# Patient Record
Sex: Female | Born: 1940 | Race: Black or African American | Hispanic: No | Smoking: Never smoker
Health system: Southern US, Community
[De-identification: ages and names within clinical notes are randomized; demographics above are authoritative.]

## PROBLEM LIST (undated history)

## (undated) DIAGNOSIS — I10 Essential (primary) hypertension: Secondary | ICD-10-CM

## (undated) DIAGNOSIS — N289 Disorder of kidney and ureter, unspecified: Secondary | ICD-10-CM

## (undated) DIAGNOSIS — E119 Type 2 diabetes mellitus without complications: Secondary | ICD-10-CM

## (undated) DIAGNOSIS — K5790 Diverticulosis of intestine, part unspecified, without perforation or abscess without bleeding: Secondary | ICD-10-CM

## (undated) DIAGNOSIS — N189 Chronic kidney disease, unspecified: Secondary | ICD-10-CM

## (undated) DIAGNOSIS — G5600 Carpal tunnel syndrome, unspecified upper limb: Secondary | ICD-10-CM

## (undated) DIAGNOSIS — K219 Gastro-esophageal reflux disease without esophagitis: Secondary | ICD-10-CM

## (undated) DIAGNOSIS — K5792 Diverticulitis of intestine, part unspecified, without perforation or abscess without bleeding: Secondary | ICD-10-CM

## (undated) DIAGNOSIS — F32A Depression, unspecified: Secondary | ICD-10-CM

## (undated) DIAGNOSIS — K449 Diaphragmatic hernia without obstruction or gangrene: Secondary | ICD-10-CM

## (undated) DIAGNOSIS — C55 Malignant neoplasm of uterus, part unspecified: Secondary | ICD-10-CM

## (undated) DIAGNOSIS — F419 Anxiety disorder, unspecified: Secondary | ICD-10-CM

## (undated) DIAGNOSIS — Z860100 Personal history of colon polyps, unspecified: Secondary | ICD-10-CM

## (undated) DIAGNOSIS — M1990 Unspecified osteoarthritis, unspecified site: Secondary | ICD-10-CM

## (undated) DIAGNOSIS — I2510 Atherosclerotic heart disease of native coronary artery without angina pectoris: Secondary | ICD-10-CM

## (undated) HISTORY — DX: Personal history of colon polyps, unspecified: Z86.0100

## (undated) HISTORY — DX: Diverticulosis of intestine, part unspecified, without perforation or abscess without bleeding: K57.90

## (undated) HISTORY — PX: TUBAL LIGATION: SHX77

## (undated) HISTORY — DX: Malignant neoplasm of uterus, part unspecified: C55

## (undated) HISTORY — DX: Depression, unspecified: F32.A

## (undated) HISTORY — DX: Diaphragmatic hernia without obstruction or gangrene: K44.9

## (undated) HISTORY — PX: ABDOMINAL HYSTERECTOMY: SHX81

## (undated) HISTORY — DX: Anxiety disorder, unspecified: F41.9

## (undated) HISTORY — DX: Chronic kidney disease, unspecified: N18.9

## (undated) HISTORY — DX: Gastro-esophageal reflux disease without esophagitis: K21.9

---

## 2011-03-28 DIAGNOSIS — M7552 Bursitis of left shoulder: Secondary | ICD-10-CM | POA: Insufficient documentation

## 2011-03-28 DIAGNOSIS — I2089 Other forms of angina pectoris: Secondary | ICD-10-CM | POA: Insufficient documentation

## 2011-03-28 DIAGNOSIS — R072 Precordial pain: Secondary | ICD-10-CM | POA: Insufficient documentation

## 2015-09-16 DIAGNOSIS — I10 Essential (primary) hypertension: Secondary | ICD-10-CM | POA: Insufficient documentation

## 2016-05-10 DIAGNOSIS — K8020 Calculus of gallbladder without cholecystitis without obstruction: Secondary | ICD-10-CM | POA: Insufficient documentation

## 2016-06-26 DIAGNOSIS — E782 Mixed hyperlipidemia: Secondary | ICD-10-CM | POA: Insufficient documentation

## 2016-07-09 HISTORY — PX: ESOPHAGOGASTRODUODENOSCOPY: SHX1529

## 2016-07-09 HISTORY — PX: COLONOSCOPY: SHX5424

## 2016-09-25 DIAGNOSIS — G500 Trigeminal neuralgia: Secondary | ICD-10-CM | POA: Insufficient documentation

## 2016-12-14 DIAGNOSIS — N183 Chronic kidney disease, stage 3 unspecified: Secondary | ICD-10-CM | POA: Insufficient documentation

## 2017-03-01 DIAGNOSIS — R2990 Unspecified symptoms and signs involving the nervous system: Secondary | ICD-10-CM | POA: Insufficient documentation

## 2017-05-16 DIAGNOSIS — R109 Unspecified abdominal pain: Secondary | ICD-10-CM | POA: Insufficient documentation

## 2018-02-05 DIAGNOSIS — R931 Abnormal findings on diagnostic imaging of heart and coronary circulation: Secondary | ICD-10-CM | POA: Insufficient documentation

## 2018-02-05 DIAGNOSIS — R9439 Abnormal result of other cardiovascular function study: Secondary | ICD-10-CM | POA: Insufficient documentation

## 2018-02-05 DIAGNOSIS — I25111 Atherosclerotic heart disease of native coronary artery with angina pectoris with documented spasm: Secondary | ICD-10-CM | POA: Insufficient documentation

## 2019-05-16 ENCOUNTER — Other Ambulatory Visit: Payer: Self-pay

## 2019-05-16 ENCOUNTER — Emergency Department (HOSPITAL_COMMUNITY): Payer: Medicare Other

## 2019-05-16 ENCOUNTER — Emergency Department (HOSPITAL_COMMUNITY)
Admission: EM | Admit: 2019-05-16 | Discharge: 2019-05-16 | Disposition: A | Discharge status: Home / Self Care | Payer: Medicare Other | Attending: Emergency Medicine | Admitting: Emergency Medicine

## 2019-05-16 ENCOUNTER — Encounter (HOSPITAL_COMMUNITY): Payer: Self-pay | Admitting: *Deleted

## 2019-05-16 DIAGNOSIS — I2510 Atherosclerotic heart disease of native coronary artery without angina pectoris: Secondary | ICD-10-CM | POA: Insufficient documentation

## 2019-05-16 DIAGNOSIS — Z7982 Long term (current) use of aspirin: Secondary | ICD-10-CM | POA: Insufficient documentation

## 2019-05-16 DIAGNOSIS — I10 Essential (primary) hypertension: Secondary | ICD-10-CM | POA: Insufficient documentation

## 2019-05-16 DIAGNOSIS — Z79899 Other long term (current) drug therapy: Secondary | ICD-10-CM | POA: Diagnosis not present

## 2019-05-16 DIAGNOSIS — N3000 Acute cystitis without hematuria: Secondary | ICD-10-CM

## 2019-05-16 DIAGNOSIS — R55 Syncope and collapse: Secondary | ICD-10-CM

## 2019-05-16 DIAGNOSIS — E119 Type 2 diabetes mellitus without complications: Secondary | ICD-10-CM | POA: Diagnosis not present

## 2019-05-16 HISTORY — DX: Diverticulitis of intestine, part unspecified, without perforation or abscess without bleeding: K57.92

## 2019-05-16 HISTORY — DX: Carpal tunnel syndrome, unspecified upper limb: G56.00

## 2019-05-16 HISTORY — DX: Disorder of kidney and ureter, unspecified: N28.9

## 2019-05-16 HISTORY — DX: Type 2 diabetes mellitus without complications: E11.9

## 2019-05-16 HISTORY — DX: Atherosclerotic heart disease of native coronary artery without angina pectoris: I25.10

## 2019-05-16 HISTORY — DX: Essential (primary) hypertension: I10

## 2019-05-16 HISTORY — DX: Unspecified osteoarthritis, unspecified site: M19.90

## 2019-05-16 LAB — CBC WITH DIFFERENTIAL/PLATELET
Abs Immature Granulocytes: 0.02 10*3/uL (ref 0.00–0.07)
Basophils Absolute: 0 10*3/uL (ref 0.0–0.1)
Basophils Relative: 0 %
Eosinophils Absolute: 0.1 10*3/uL (ref 0.0–0.5)
Eosinophils Relative: 1 %
HCT: 36.1 % (ref 36.0–46.0)
Hemoglobin: 11.9 g/dL — ABNORMAL LOW (ref 12.0–15.0)
Immature Granulocytes: 0 %
Lymphocytes Relative: 14 %
Lymphs Abs: 1.3 10*3/uL (ref 0.7–4.0)
MCH: 31.5 pg (ref 26.0–34.0)
MCHC: 33 g/dL (ref 30.0–36.0)
MCV: 95.5 fL (ref 80.0–100.0)
Monocytes Absolute: 1 10*3/uL (ref 0.1–1.0)
Monocytes Relative: 11 %
Neutro Abs: 6.6 10*3/uL (ref 1.7–7.7)
Neutrophils Relative %: 74 %
Platelets: 260 10*3/uL (ref 150–400)
RBC: 3.78 MIL/uL — ABNORMAL LOW (ref 3.87–5.11)
RDW: 13.7 % (ref 11.5–15.5)
WBC: 9 10*3/uL (ref 4.0–10.5)
nRBC: 0 % (ref 0.0–0.2)

## 2019-05-16 LAB — URINALYSIS, ROUTINE W REFLEX MICROSCOPIC
Bilirubin Urine: NEGATIVE
Glucose, UA: NEGATIVE mg/dL
Ketones, ur: NEGATIVE mg/dL
Nitrite: POSITIVE — AB
Protein, ur: NEGATIVE mg/dL
Specific Gravity, Urine: 1.012 (ref 1.005–1.030)
WBC, UA: >50 WBC/hpf — ABNORMAL HIGH (ref 0–5)
pH: 7 (ref 5.0–8.0)

## 2019-05-16 LAB — COMPREHENSIVE METABOLIC PANEL
ALT: 9 U/L (ref 0–44)
AST: 14 U/L — ABNORMAL LOW (ref 15–41)
Albumin: 2.8 g/dL — ABNORMAL LOW (ref 3.5–5.0)
Alkaline Phosphatase: 52 U/L (ref 38–126)
Anion gap: 9 (ref 5–15)
BUN: 24 mg/dL — ABNORMAL HIGH (ref 8–23)
CO2: 25 mmol/L (ref 22–32)
Calcium: 8.9 mg/dL (ref 8.9–10.3)
Chloride: 102 mmol/L (ref 98–111)
Creatinine, Ser: 1.55 mg/dL — ABNORMAL HIGH (ref 0.44–1.00)
GFR calc Af Amer: 37 mL/min — ABNORMAL LOW (ref >60)
GFR calc non Af Amer: 32 mL/min — ABNORMAL LOW (ref >60)
Glucose, Bld: 162 mg/dL — ABNORMAL HIGH (ref 70–99)
Potassium: 4.4 mmol/L (ref 3.5–5.1)
Sodium: 136 mmol/L (ref 135–145)
Total Bilirubin: 0.6 mg/dL (ref 0.3–1.2)
Total Protein: 6.2 g/dL — ABNORMAL LOW (ref 6.5–8.1)

## 2019-05-16 LAB — LIPASE, BLOOD: Lipase: 18 U/L (ref 11–51)

## 2019-05-16 MED ORDER — CEPHALEXIN 500 MG PO CAPS: 500.0000 mg | ORAL_CAPSULE | Freq: Four times a day (QID) | ORAL | 0 refills | Status: DC

## 2019-05-16 MED ORDER — SODIUM CHLORIDE 0.9 % IV BOLUS
1000.0000 mL | Freq: Once | INTRAVENOUS | Status: AC
  Administered 2019-05-16: 1000 mL via INTRAVENOUS

## 2019-05-16 MED ORDER — SODIUM CHLORIDE 0.9 % IV SOLN
1.0000 g | Freq: Once | INTRAVENOUS | Status: AC
  Administered 2019-05-16: 1 g via INTRAVENOUS
  Filled 2019-05-16: qty 10

## 2019-05-16 MED ORDER — SODIUM CHLORIDE 0.9 % IV SOLN
Freq: Once | INTRAVENOUS | Status: AC
  Administered 2019-05-16: 15:00:00 via INTRAVENOUS

## 2019-05-16 MED ORDER — FUROSEMIDE 20 MG PO TABS: 20.0000 mg | ORAL_TABLET | Freq: Every day | ORAL | 0 refills | Status: AC

## 2019-05-16 NOTE — ED Provider Notes (Signed)
Pt signed out by Dr. Ronnald Nian pending FP resident eval.  Pt is feeling better and now feels comfortable going home.  Pt to stop metoprolol and decrease lasix down to 20 mg.  Pt will be d/c home with keflex for her UTI.  She knows to return if worse.  F/u with FP on Monday the 9th as scheduled.   Isla Pence, MD 05/16/19 [phone]

## 2019-05-16 NOTE — ED Triage Notes (Signed)
PT reported taking a laxative last night  for constipation for 2 weeks  . Pt reported she was in BR and had a abd cramping and near syncope while on toilet . Pt was cold ,sweating and ABD pain.  EMS gave 250 NS IV. PT A/O on arrival  To room . EMS vitals BP 128/66, 54 HR, 185 CBG.  Pt lives with daughter and has just moved from [place] to live with Daughter.

## 2019-05-16 NOTE — ED Notes (Signed)
Patient verbalizes understanding of discharge instructions . Opportunity for questions and answers were provided . Armband removed by staff ,Pt discharged from ED. W/C  offered at D/C  and Declined W/C at D/C and was escorted to lobby by RN.  

## 2019-05-16 NOTE — Discharge Instructions (Addendum)
Stay on Lisinopril. Stop Metoprolol. Take Lasix 20 mg instead of 40 mg.

## 2019-05-16 NOTE — ED Provider Notes (Signed)
[facility] EMERGENCY DEPARTMENT Provider Note   CSN: [id number] Arrival date & time: 05/16/19  0920     History   Chief Complaint Chief Complaint  Patient presents with   Near Syncope    HPI Linda Frye is a 78 y.o. female.     The history is provided by the patient.  Loss of Consciousness Episode history:  Single Most recent episode:  Today Progression:  Resolved Chronicity:  New Context: bowel movement and sitting down   Witnessed: yes   Relieved by:  Bed rest Worsened by:  Nothing Associated symptoms: diaphoresis, dizziness and nausea   Associated symptoms: no anxiety, no chest pain, no fever, no focal weakness, no palpitations, no seizures, no shortness of breath and no vomiting   Risk factors: coronary artery disease   Risk factors: no seizures     Past Medical History:  Diagnosis Date   Acute carpal tunnel syndrome    bilateral wrists   Arthritis    Coronary artery disease    Diabetes mellitus without complication (HCC)    Diverticulitis    Hypertension    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   History reviewed. No pertinent surgical history.   OB History   No obstetric history on file.      Home Medications    Prior to Admission medications   Medication Sig Start Date End Date Taking? Authorizing Provider  aspirin EC 81 MG tablet Take 81 mg by mouth daily.   Yes [provider]  cholecalciferol (VITAMIN D3) 25 MCG (1000 UT) tablet Take 1,000 Units by mouth daily.   Yes [provider]  furosemide (LASIX) 40 MG tablet Take 40 mg by mouth daily.   Yes [provider]  lisinopril (ZESTRIL) 5 MG tablet Take 5 mg by mouth daily.   Yes [provider]  metoprolol tartrate (LOPRESSOR) 25 MG tablet Take 12.5 mg by mouth as needed (for blood pressure >140).    Yes [provider]  nateglinide (STARLIX) 120 MG  tablet Take 120 mg by mouth 3 (three) times daily with meals.   Yes [provider]  paricalcitol (ZEMPLAR) 1 MCG capsule Take 1 mcg by mouth daily.   Yes [provider]  ranolazine (RANEXA) 1000 MG SR tablet Take 500 mg by mouth 2 (two) times daily.   Yes [provider]  simvastatin (ZOCOR) 40 MG tablet Take 40 mg by mouth daily.   Yes [provider]  tetrahydrozoline 0.05 % ophthalmic solution Place 1 drop into both eyes daily as needed (dry eyes).   Yes [provider]  vitamin B-12 (CYANOCOBALAMIN) 500 MCG tablet Take 500 mcg by mouth daily.   Yes [provider]    Family History History reviewed. No pertinent family history.  Social History Social History   Tobacco Use   Smoking status: Never Smoker   Smokeless tobacco: Never Used  Substance Use Topics   Alcohol use: Not Currently   Drug use: Never     Allergies   Patient has no known allergies.   Review of Systems Review of Systems  Constitutional: Positive for diaphoresis. Negative for chills and fever.  HENT: Negative for ear pain and sore throat.   Eyes: Negative for pain and visual disturbance.  Respiratory: Negative for cough and shortness of breath.   Cardiovascular: Positive for syncope. Negative for chest pain and palpitations.  Gastrointestinal:  Positive for constipation and nausea. Negative for abdominal pain and vomiting.  Genitourinary: Negative for dysuria and hematuria.  Musculoskeletal: Negative for arthralgias and back pain.  Skin: Negative for color change and rash.  Neurological: Positive for dizziness. Negative for focal weakness, seizures and syncope.  All other systems reviewed and are negative.    Physical Exam Updated Vital Signs BP 103/84 (BP Location: Right Arm)    Pulse 62    Temp 98 F (36.7 C) (Oral)    Resp 20    Ht  (1.549 m)    Wt 99.8 kg    SpO2 100%    BMI 41.57 kg/m   Physical Exam Vitals signs and nursing note  reviewed.  Constitutional:      General: She is not in acute distress.    Appearance: She is well-developed.  HENT:     Head: Normocephalic and atraumatic.     Nose: Nose normal.     Mouth/Throat:     Mouth: Mucous membranes are moist.  Eyes:     Extraocular Movements: Extraocular movements intact.     Conjunctiva/sclera: Conjunctivae normal.     Pupils: Pupils are equal, round, and reactive to light.  Neck:     Musculoskeletal: Normal range of motion and neck supple.  Cardiovascular:     Rate and Rhythm: Normal rate and regular rhythm.     Pulses: Normal pulses.     Heart sounds: Normal heart sounds. No murmur.  Pulmonary:     Effort: Pulmonary effort is normal. No respiratory distress.     Breath sounds: Normal breath sounds.  Abdominal:     General: Abdomen is flat.     Palpations: Abdomen is soft. There is no mass.     Tenderness: There is abdominal tenderness. There is no guarding or rebound.     Hernia: No hernia is present.  Musculoskeletal: Normal range of motion.        General: No swelling.  Skin:    General: Skin is warm and dry.  Neurological:     General: No focal deficit present.     Mental Status: She is alert and oriented to person, place, and time.     Cranial Nerves: No cranial nerve deficit.     Sensory: No sensory deficit.     Motor: No weakness.     Coordination: Coordination normal.  Psychiatric:        Mood and Affect: Mood normal.      ED Treatments / Results  Labs (all labs ordered are listed, but only abnormal results are displayed) Labs Reviewed  CBC WITH DIFFERENTIAL/PLATELET - Abnormal; Notable for the following components:      Result Value   RBC 3.78 (*)    Hemoglobin 11.9 (*)    All other components within normal limits  COMPREHENSIVE METABOLIC PANEL - Abnormal; Notable for the following components:   Glucose, Bld 162 (*)    BUN 24 (*)    Creatinine, Ser 1.55 (*)    Total Protein 6.2 (*)    Albumin 2.8 (*)    AST 14 (*)     GFR calc non Af Amer 32 (*)    GFR calc Af Amer 37 (*)    All other components within normal limits  URINALYSIS, ROUTINE W REFLEX MICROSCOPIC - Abnormal; Notable for the following components:   APPearance CLOUDY (*)    Hgb urine dipstick SMALL (*)    Nitrite POSITIVE (*)    Leukocytes,Ua LARGE (*)  WBC, UA >50 (*)    Bacteria, UA MANY (*)    All other components within normal limits  URINE CULTURE  LIPASE, BLOOD    EKG EKG Interpretation  Date/Time:  Saturday May 16 2019 09:26:08 EST Ventricular Rate:  62 PR Interval:    QRS Duration: 77 QT Interval:  423 QTC Calculation: 430 R Axis:   14 Text Interpretation: Sinus rhythm Confirmed by Virgina NorfolkAdam, Aisha Greenberger [phone]([id number]) on 05/16/2019 9:33:12 AM   Radiology Ct Abdomen Pelvis Wo Contrast  Result Date: 05/16/2019 CLINICAL DATA:  Abdominal distension, constipation, syncope EXAM: CT ABDOMEN AND PELVIS WITHOUT CONTRAST TECHNIQUE: Multidetector CT imaging of the abdomen and pelvis was performed following the standard protocol without IV contrast. COMPARISON:  None. FINDINGS: Lower chest: Very mild subpleural reticulation in the visualized lung bases. Hepatobiliary: Unenhanced liver is unremarkable. Layering small gallstones (series 3/image 24), without associated inflammatory changes. No intrahepatic or extrahepatic duct dilatation. Pancreas: Within normal limits. Spleen: Within normal limits. Adrenals/Urinary Tract: Adrenal glands are within normal limits. 7 mm lateral right lower pole renal cyst (series 3/image 40). Left kidney is within normal limits. No renal, ureteral, or bladder calculi. Right extrarenal pelvis. No hydronephrosis. Bladder is within normal limits. Stomach/Bowel: Stomach is within normal limits. No evidence of bowel obstruction. Normal appendix (series 3/image 54). Extensive left colonic diverticulosis, without evidence of diverticulitis. Normal colonic stool burden. Vascular/Lymphatic: No evidence of abdominal aortic aneurysm.  Atherosclerotic calcifications of the abdominal aorta and branch vessels. No suspicious abdominopelvic lymphadenopathy. Reproductive: Status post hysterectomy. No adnexal masses. Other: No abdominopelvic ascites. Musculoskeletal: Degenerative changes of the visualized thoracolumbar spine. IMPRESSION: No evidence of bowel obstruction. Normal appendix. Extensive left colonic diverticulosis, without evidence of diverticulitis. Normal colonic stool burden. Cholelithiasis, without associated inflammatory changes. Additional ancillary findings as above. Electronically Signed   By: Charline BillsSriyesh  Krishnan M.D.   On: 05/16/2019 12:19   Ct Head Wo Contrast  Result Date: 05/16/2019 CLINICAL DATA:  78 year old female with head trauma EXAM: CT HEAD WITHOUT CONTRAST TECHNIQUE: Contiguous axial images were obtained from the base of the skull through the vertex without intravenous contrast. COMPARISON:  None. FINDINGS: Brain: No acute intracranial hemorrhage. No midline shift or mass effect. Gray-white differentiation maintained. Unremarkable appearance of the ventricular system. Vascular: Intracranial atherosclerosis Skull: No acute fracture.  No aggressive bone lesion identified. Sinuses/Orbits: Unremarkable appearance of the orbits. Mastoid air cells clear. No middle ear effusion. No significant sinus disease. Other: None IMPRESSION: Negative head CT Electronically Signed   By: Gilmer MorJaime  Wagner D.O.   On: 05/16/2019 12:12    Procedures Procedures (including critical care time)  Medications Ordered in ED Medications  cefTRIAXone (ROCEPHIN) 1 g in sodium chloride 0.9 % 100 mL IVPB (has no administration in time range)  0.9 %  sodium chloride infusion (has no administration in time range)  sodium chloride 0.9 % bolus 1,000 mL (0 mLs Intravenous Stopped 05/16/19 1151)     Initial Impression / Assessment and Plan / ED Course  I have reviewed the triage vital signs and the nursing notes.  Pertinent labs & imaging results  that were available during my care of the patient were reviewed by me and considered in my medical decision making (see chart for details).     Yevette EdwardsCatherine Iezzi is a 78 year old female with history of diabetes, CAD who presents to the ED after syncopal event.  Patient with unremarkable vitals, no fever.  Patient was sitting on the toilet wall trying to have a bowel movement when she got cold,  sweaty, lightheaded and possibly lost consciousness for a minute or 2.  Did not fall or hit her head per daughter but family not quite confident about that.  Patient appears neurologically intact.  She is asymptomatic now.  She has been taking a laxative for constipation for the last several weeks but states that she has been started to have some diarrhea, abdominal cramping.  Has some abdominal tenderness diffusely on exam.  No focal tenderness on abdominal exam.  History and physical appears consistent with likely vasovagal event/orthostatic event.  Does not have any chest pain or shortness of breath.  Has been feeling generally well.  Has had some abdominal discomfort for the last day or so.  We will get basic labs.  EKG shows sinus rhythm.  No ischemic changes.  Doubt cardiac process.  Will obtain CT scan abdomen and pelvis.  Will get CT scan of head as unsure if patient hit her head.  Will give IV fluid hydration.  Will reevaluate.  Possible dehydration versus electrolyte abnormalities versus constipation versus bowel obstruction.  Patient did get mildly hypotensive with orthostatics.  Urinalysis showed UTI.  Otherwise no significant electrolyte abnormality.  Creatinine mildly elevated.  No prior labs to compare to.  Patient recently moved here.  CT scan of the head was unremarkable.  CT abdomen and pelvis showed no acute findings.  No bowel obstruction.  Overall appears to be dehydrated with positive orthostatics and with urinary tract infection.  Will admit for observation stay with medicine.  This chart was  dictated using voice recognition software.  Despite best efforts to proofread,  errors can occur which can change the documentation meaning.      Final Clinical Impressions(s) / ED Diagnoses   Final diagnoses:  Syncope and collapse  Acute cystitis without hematuria    ED Discharge Orders    None       Virgina Norfolk, DO 05/16/19 1441

## 2019-05-16 NOTE — H&P (Addendum)
Family Medicine Teaching [facility] Admission History and Physical Service Pager: [phone]  Patient name: Linda Frye Medical record number: [id number] Date of birth: 1940-10-07 Age: 78 y.o. Gender: female  Primary Care Provider: Patient, No Pcp Per Consultants: none Code Status: Full Preferred Emergency Contact: Lenoria Farrier [phone]) [phone]  Chief Complaint: passing out  Assessment and Plan: Asma Boldon is a 78 y.o. female presenting with syncopal episode. PMH is significant for CAD, hypertension, CKD, diabetes type 2, diverticulitis, carpal tunnel, gallstones  Syncopal episode likely secondary to orthostatic hypotension likely 2/2 to dehydration Patient was repositioning from sitting to standing position when she developed dizziness, diaphoresis and passed out for 3 to 4 minutes. Regained consciousness shortly thereafter and can recall events prior to syncopal episode. This episode was witnessed by her daughter who denied her hitting her head. No jerky movements, no incontinence of bowel or bladder. Denies any chest pain, shortness of breath, nausea or vomiting.  CT head negative for intracranial hemorrhage or acute infarct.  Labs significant for creatinine 1.55, GFR 37.  EKG shows normal sinus rhythm heart rate 62 QTc 430, negative for arrhythmia, pt on metoprolol. On exam patient alert and orientated x4. Currently back to baseline. The patient has been constipated recently, for which she took a laxative last night. She woke up this morning having loose stools which is what sent her to the bathroom.  No current dizziness, chest pain or shortness of breath.  Given that patient was having diarrhea, on Lasix and concentrated urine most likely syncopal episode due to dehydration and orthostatic hypotension. EKG was negative for arryhthmia. -In discussion with patient and family it was decided to discharge patient home and follow-up with Dr. Karen Chafe in clinic on Monday at 1:30  PM. -Hold metoprolol -Decrease Lasix to 20 mg daily -Continue Lisinopril 5 mg daily, consider switch to ARB to decrease cough -Encourage po intake 1.5L daily -Encourage compression stockings. -Patient instructed to return to ED if worsening syncopal episodes prior to seeing doctor in clinic on Monday.  Dehydration Patient taking Lasix 40 mg daily.  Recently having diarrhea from laxatives.  Also reports having concentrated urine.  Normal saline 1 L bolus given in ED.  -Encourage oral hydration -Decrease Lasix to 20 mg daily -Follow-up in clinic on Monday for further evaluation. -Recheck BUN and creatinine outpatient  Hypotension Patient was hypotensive initially in the ED with systolic blood pressure in the 80s.  She was given a liter bolus normal saline.  Most recent blood pressure 110/52, was 125/65 on repeat check in the room s/p fluid.  Orthostatic vitals completed.  Home medications metoprolol 12.5 mg for blood pressure greater than 140.  Lasix 40 mg daily.  Lisinopril 5 mg daily.  Ranexa 500 mg twice daily -Decrease Lasix to 20 mg daily -Hold metoprolol -Continue Renexa for now but may need to be discontinued outpatient - know that this med can cause/worsen Orthostatic Hypotension and Bradycardia  CKD Creatinine 1.55. GFR 37, BL Creatinine Unknown. Patient currently on lisinopril 5 mg daily.  Reports having cough since started medication.  Patient also reports history of chronic kidney disease.  States that her kidneys working at 30% function.  CT abdomen showed a 7 mm lateral right lower pole renal cyst. -Continue oral hydration -Follow-up in clinic for repeat kidney function test. -Continue lisinopril 5 mg daily -Consider switch to low dose ARB to decrease cough side effect . -Consider consult nephrology outpatient.  CAD Patient reports having been hospitalized in September 2020 with chest pain.  Stress test at that time was negative.  Currently asymptomatic.  Home medication  Ranexa. -Continue Ranexa until seen in clinic on Monday. -Consider cardiology outpatient follow-up as Ranexa can cause hypotension, orthostatic hypotension and bradycardia.  Type 2 diabetes Patient reports last hemoglobin A1c 6.1.  Home medication Starlix 120 mg 3 times daily.  Serum glucose 162 on admission. -Continue current medication  Asymptomatic bacteriuria Patient denies any dysuria, urgency or frequency.  She does have frequency due to taking Lasix 40 mg daily.  Denies any fever, abdominal pain.  Urinalysis was collected in the ED and was positive for nitrates, large amount of leukocytes and squamous epithelial cells.  Likely secondary to dirty catch.  Patient reports having found herself while trying to get the bathroom earlier today.  She was treated with IV ceftriaxone x1 in ED. -Continue antibiotics -Follow-up in clinic on Monday.   Disposition: Patient's vitals are improving after receiving IV fluids. She feels safe enough to discharge home and follow up outpatient. Patient will therefore be discharged home with follow-up appointment Monday, November 11 at 1:30 PM with Dr[facility].  History of Present Illness:  Linda EdwardsCatherine Frye is a 78 y.o. female presenting with syncopal episode   Patient was in bed and felt the need to move her bowels.  She had been constipated for a few days and was taking laxatives.  She was making her into the bathroom and had soiled herself with diarrhea.  She reports when she had finished she was in the process of cleaning herself up when she felt dizzy and started to pass out.  She recalls getting up off the toilet when she began to have sweating episode.  Her daughter came to check on her reports that mother passed out for about 3 to 4 minutes.  No incontinence of urine or defecation.  No seizure-like activity.  EMS was called at that time.  She went back to check on her mom who was alert at this time but did not know what happened.  Daughter  thought maybe blood sugars were low, and when checked they were in the 160s.  EMS arrived and she was brought to the ED for further evaluation.  Patient reports feeling dizzy for the past few months and recently has gotten worse.  She reports having some balance issues that have not been new but seem to be getting worse.  She denies any fever, nausea vomiting, no chest pain or shortness of breath.  She does report a cough but attributes it to taking lisinopril.  No decrease in appetite.  She reports drinking lots of water about 132 ounce bottle daily.  She also reports drinking lots of coffee and juice.  She denies any urinary symptoms, no dysuria, urgency, abdominal pain or fever.  Her daughter reports she has been sleeping a lot harder lately.  Patient also reports some decrease in vision and was recently seen by ophthalmology.  She does wear glasses daily.  She denies any tobacco use, EtOH use or street drugs.  She reports having history of cervical cancer and hysterectomy few years ago.   In the ED she was given 1 L normal saline bolus, ceftriaxone 1 g IV.  Labs significant for creatinine 1.55.  Urinalysis positive for leukocytes, nitrates, and squamous epithelial cells indicating dirty catch.  CT abdomen/pelvis shows no evidence of bowel obstruction.  Also extensive left colonic diverticulosis without evidence of diverticulitis.  Also significant for cholelithiasis without any inflammatory changes.  As well  as 7 mm right lower pole renal cyst.  CT head negative for acute intracranial hemorrhage or infarct.  EKG shows normal sinus rhythm.  Review Of Systems: Per HPI with the following additions:   Review of Systems  Constitutional: Positive for diaphoresis. Negative for fever.  Respiratory: Positive for cough. Negative for shortness of breath.   Cardiovascular: Negative for chest pain.  Gastrointestinal: Positive for constipation and diarrhea. Negative for nausea and vomiting.  Genitourinary:  Positive for frequency. Negative for dysuria and hematuria.  Skin: Negative for rash.  Neurological: Positive for dizziness.    There are no active problems to display for this patient.   Past Medical History: Past Medical History:  Diagnosis Date  . Acute carpal tunnel syndrome    bilateral wrists  . Arthritis   . Coronary artery disease   . Diabetes mellitus without complication ([place])   . Diverticulitis   . Hypertension   . Renal disorder    kidneys 35% function    Past Surgical History: History reviewed. No pertinent surgical history.  Social History: Social History   Tobacco Use  . Smoking status: Never Smoker  . Smokeless tobacco: Never Used  Substance Use Topics  . Alcohol use: Not Currently  . Drug use: Never   Additional social history: Husband recently passed away few months ago.  She recently moved to [place] area and lives with daughter. Please also refer to relevant sections of EMR.  Family History: History reviewed. No pertinent family history. (If not completed, MUST add something in)  Allergies and Medications: No Known Allergies No current facility-administered medications on file prior to encounter.    Current Outpatient Medications on File Prior to Encounter  Medication Sig Dispense Refill  . aspirin EC 81 MG tablet Take 81 mg by mouth daily.    . cholecalciferol (VITAMIN D3) 25 MCG (1000 UT) tablet Take 1,000 Units by mouth daily.    Marland Kitchen lisinopril (ZESTRIL) 5 MG tablet Take 5 mg by mouth daily.    . nateglinide (STARLIX) 120 MG tablet Take 120 mg by mouth 3 (three) times daily with meals.    . paricalcitol (ZEMPLAR) 1 MCG capsule Take 1 mcg by mouth daily.    . ranolazine (RANEXA) 1000 MG SR tablet Take 500 mg by mouth 2 (two) times daily.    . simvastatin (ZOCOR) 40 MG tablet Take 40 mg by mouth daily.    Marland Kitchen tetrahydrozoline 0.05 % ophthalmic solution Place 1 drop into both eyes daily as needed (dry eyes).    . vitamin B-12 (CYANOCOBALAMIN)  500 MCG tablet Take 500 mcg by mouth daily.    . [DISCONTINUED] metoprolol tartrate (LOPRESSOR) 25 MG tablet Take 12.5 mg by mouth as needed (for blood pressure >140).       Objective: BP (!) 112/52   Pulse 61   Temp 98 F (36.7 C) (Oral)   Resp 19   Ht 5\' 1"  (1.549 m)   Wt 99.8 kg   SpO2 99%   BMI 41.57 kg/m  Exam: General: 78 year old female in no acute distress Eyes: Pupils equal and reactive to light, able to produce tears ENTM: TMs normal, ear canals normal, mucous membranes moist.  Neck: Supple, nontender, no lymphadenopathy Cardiovascular: Regular rate and rhythm, no murmurs appreciated, no gallops, no rubs Respiratory: Chest clear to auscultation bilaterally, no crackles, no rhonchi, no wheezes noted, no increased work of breathing Gastrointestinal: Soft, nontender, nondistended, bowel sounds present MSK: Moving all extremities, bilateral lower extremity edema extending to mid calf.  Derm: Warm and dry Neuro: Alert and orientated x4, cranial nerves III to XII intact Psych: Pleasant and cooperative, follows commands, answers questions appropriately  Labs and Imaging: CBC BMET  Recent Labs  Lab 05/16/19 1017  WBC 9.0  HGB 11.9*  HCT 36.1  PLT 260   Recent Labs  Lab 05/16/19 1017  NA 136  K 4.4  CL 102  CO2 25  BUN 24*  CREATININE 1.55*  GLUCOSE 162*  CALCIUM 8.9     EKG: Sinus rhythm Vent. rate 62 BPM PR interval * ms QRS duration 77 ms QT/QTc 423/430 ms  P-R-T axes 66 14 54  Ct Abdomen Pelvis Wo Contrast  Result Date: 05/16/2019 CLINICAL DATA:  Abdominal distension, constipation, syncope EXAM: CT ABDOMEN AND PELVIS WITHOUT CONTRAST TECHNIQUE: Multidetector CT imaging of the abdomen and pelvis was performed following the standard protocol without IV contrast. COMPARISON:  None. FINDINGS: Lower chest: Very mild subpleural reticulation in the visualized lung bases. Hepatobiliary: Unenhanced liver is unremarkable. Layering small gallstones (series 3/image  24), without associated inflammatory changes. No intrahepatic or extrahepatic duct dilatation. Pancreas: Within normal limits. Spleen: Within normal limits. Adrenals/Urinary Tract: Adrenal glands are within normal limits. 7 mm lateral right lower pole renal cyst (series 3/image 40). Left kidney is within normal limits. No renal, ureteral, or bladder calculi. Right extrarenal pelvis. No hydronephrosis. Bladder is within normal limits. Stomach/Bowel: Stomach is within normal limits. No evidence of bowel obstruction. Normal appendix (series 3/image 54). Extensive left colonic diverticulosis, without evidence of diverticulitis. Normal colonic stool burden. Vascular/Lymphatic: No evidence of abdominal aortic aneurysm. Atherosclerotic calcifications of the abdominal aorta and branch vessels. No suspicious abdominopelvic lymphadenopathy. Reproductive: Status post hysterectomy. No adnexal masses. Other: No abdominopelvic ascites. Musculoskeletal: Degenerative changes of the visualized thoracolumbar spine. IMPRESSION: No evidence of bowel obstruction. Normal appendix. Extensive left colonic diverticulosis, without evidence of diverticulitis. Normal colonic stool burden. Cholelithiasis, without associated inflammatory changes. Additional ancillary findings as above. Electronically Signed   By: Charline Bills M.D.   On: 05/16/2019 12:19   Ct Head Wo Contrast  Result Date: 05/16/2019 CLINICAL DATA:  78 year old female with head trauma EXAM: CT HEAD WITHOUT CONTRAST TECHNIQUE: Contiguous axial images were obtained from the base of the skull through the vertex without intravenous contrast. COMPARISON:  None. FINDINGS: Brain: No acute intracranial hemorrhage. No midline shift or mass effect. Gray-white differentiation maintained. Unremarkable appearance of the ventricular system. Vascular: Intracranial atherosclerosis Skull: No acute fracture.  No aggressive bone lesion identified. Sinuses/Orbits: Unremarkable appearance of  the orbits. Mastoid air cells clear. No middle ear effusion. No significant sinus disease. Other: None IMPRESSION: Negative head CT Electronically Signed   By: Gilmer Mor D.O.   On: 05/16/2019 12:12    Dana Allan, MD 05/16/2019, 6:19 PM PGY-1, [facility] FPTS Intern pager: [phone], text pages welcome   FPTS Upper-Level Resident Addendum   I have independently interviewed and examined the patient. I have discussed the above with the original author and agree with their documentation. My edits for correction/addition/clarification are in blue. Please see also any attending notes.    Peggyann Shoals, DO PGY-2, [facility] 05/16/2019 6:52 PM  FPTS Service pager: [phone] (text pages welcome through [employer])

## 2019-05-18 ENCOUNTER — Ambulatory Visit (INDEPENDENT_AMBULATORY_CARE_PROVIDER_SITE_OTHER): Payer: Medicare Other | Admitting: Family Medicine

## 2019-05-18 ENCOUNTER — Encounter: Payer: Self-pay | Admitting: Family Medicine

## 2019-05-18 ENCOUNTER — Other Ambulatory Visit: Payer: Self-pay

## 2019-05-18 VITALS — BP 98/52 | HR 60 | Wt 216.4 lb

## 2019-05-18 DIAGNOSIS — E1121 Type 2 diabetes mellitus with diabetic nephropathy: Secondary | ICD-10-CM

## 2019-05-18 DIAGNOSIS — N3000 Acute cystitis without hematuria: Secondary | ICD-10-CM

## 2019-05-18 DIAGNOSIS — I951 Orthostatic hypotension: Secondary | ICD-10-CM | POA: Diagnosis not present

## 2019-05-18 DIAGNOSIS — N390 Urinary tract infection, site not specified: Secondary | ICD-10-CM | POA: Insufficient documentation

## 2019-05-18 DIAGNOSIS — N1832 Chronic kidney disease, stage 3b: Secondary | ICD-10-CM | POA: Diagnosis not present

## 2019-05-18 DIAGNOSIS — R55 Syncope and collapse: Secondary | ICD-10-CM | POA: Diagnosis not present

## 2019-05-18 DIAGNOSIS — N183 Chronic kidney disease, stage 3 unspecified: Secondary | ICD-10-CM | POA: Insufficient documentation

## 2019-05-18 DIAGNOSIS — E119 Type 2 diabetes mellitus without complications: Secondary | ICD-10-CM | POA: Insufficient documentation

## 2019-05-18 DIAGNOSIS — E1122 Type 2 diabetes mellitus with diabetic chronic kidney disease: Secondary | ICD-10-CM

## 2019-05-18 LAB — URINE CULTURE: Culture: >=100000 — AB

## 2019-05-18 MED ORDER — OMEPRAZOLE 20 MG PO CPDR: 20.0000 mg | DELAYED_RELEASE_CAPSULE | Freq: Every day | ORAL | 3 refills | Status: DC

## 2019-05-18 MED ORDER — VALSARTAN 40 MG PO TABS: 40.0000 mg | ORAL_TABLET | Freq: Every day | ORAL | 3 refills | Status: DC

## 2019-05-18 NOTE — Assessment & Plan Note (Signed)
Continue Keflex 500mg  QID for 5 more days

## 2019-05-18 NOTE — Progress Notes (Addendum)
Subjective: Chief Complaint  Patient presents with  . Hospitalization Follow-up    HPI: Linda Frye is a 78 y.o. presenting to clinic today to discuss the following:  Hospital Follow Up for Syncope Patient is a 78y/o female with PMH of HTN, T2DM, CKDIII here for a hospital follow up after an episode of what appeared to be vasovagal syncope. She was found to have CKD stage III while in the hospital and reports seeing a kidney doctor in here former state of [place]. She states today she feels much better and has had no more episodes of syncope or near syncope. She denies chest pain, SOB, dizziness, or any difficulty breathing. She does report to me having a cough on lisinopril. She is taking Keflex for her UTI as directed. She confirms she is no longer taking Metoprolol and is only taking 20mg  of Lasix. She is not checking her morning fasting glucose or daily weights at home.   ROS noted in HPI.    Social History   Tobacco Use  Smoking Status Never Smoker  Smokeless Tobacco Never Used    Objective: BP (!) 98/52   Pulse 60   Wt 216 lb 6.4 oz (98.2 kg)   SpO2 99%   BMI 40.89 kg/m  Vitals and nursing notes reviewed  Physical Exam Gen: Alert and Oriented x 3, NAD HEENT: Normocephalic, atraumatic, PERRLA, EOMI CV: RRR, no murmurs, normal S1, S2 split Resp: CTAB, no wheezing, rales, or rhonchi, comfortable work of breathing Abd: obese, non-tender, soft Ext: no clubbing, cyanosis, +1 bilateral LE edema Skin: warm, dry, intact, no rashes   Results for orders placed or performed during the hospital encounter of 05/16/19 (from the past 72 hour(s))  CBC with Differential     Status: Abnormal   Collection Time: 05/16/19 10:17 AM  Result Value Ref Range   WBC 9.0 4.0 - 10.5 K/uL   RBC 3.78 (L) 3.87 - 5.11 MIL/uL   Hemoglobin 11.9 (L) 12.0 - 15.0 g/dL   HCT 36.1 36.0 - 46.0 %   MCV 95.5 80.0 - 100.0 fL   MCH 31.5 26.0 - 34.0 pg   MCHC 33.0 30.0 - 36.0 g/dL   RDW  13.7 11.5 - 15.5 %   Platelets 260 150 - 400 K/uL   nRBC 0.0 0.0 - 0.2 %   Neutrophils Relative % 74 %   Neutro Abs 6.6 1.7 - 7.7 K/uL   Lymphocytes Relative 14 %   Lymphs Abs 1.3 0.7 - 4.0 K/uL   Monocytes Relative 11 %   Monocytes Absolute 1.0 0.1 - 1.0 K/uL   Eosinophils Relative 1 %   Eosinophils Absolute 0.1 0.0 - 0.5 K/uL   Basophils Relative 0 %   Basophils Absolute 0.0 0.0 - 0.1 K/uL   Immature Granulocytes 0 %   Abs Immature Granulocytes 0.02 0.00 - 0.07 K/uL    Comment: Performed at [facility], [address] [place], [place] [address]  Comprehensive metabolic panel     Status: Abnormal   Collection Time: 05/16/19 10:17 AM  Result Value Ref Range   Sodium 136 135 - 145 mmol/L   Potassium 4.4 3.5 - 5.1 mmol/L   Chloride 102 98 - 111 mmol/L   CO2 25 22 - 32 mmol/L   Glucose, Bld 162 (H) 70 - 99 mg/dL   BUN 24 (H) 8 - 23 mg/dL   Creatinine, Ser 1.55 (H) 0.44 - 1.00 mg/dL   Calcium 8.9 8.9 - 10.3 mg/dL  Total Protein 6.2 (L) 6.5 - 8.1 g/dL   Albumin 2.8 (L) 3.5 - 5.0 g/dL   AST 14 (L) 15 - 41 U/L   ALT 9 0 - 44 U/L   Alkaline Phosphatase 52 38 - 126 U/L   Total Bilirubin 0.6 0.3 - 1.2 mg/dL   GFR calc non Af Amer 32 (L) >60 mL/min   GFR calc Af Amer 37 (L) >60 mL/min   Anion gap 9 5 - 15    Comment: Performed at [facility], [address], [place], [place] [address]  Lipase, blood     Status: None   Collection Time: 05/16/19 10:17 AM  Result Value Ref Range   Lipase 18 11 - 51 U/L    Comment: Performed at [facility], [address], [place], [place] [address]  Urinalysis, Routine w reflex microscopic     Status: Abnormal   Collection Time: 05/16/19  1:15 PM  Result Value Ref Range   Color, Urine YELLOW YELLOW   APPearance CLOUDY (A) CLEAR   Specific Gravity, Urine 1.012 1.005 - 1.030   pH 7.0 5.0 - 8.0   Glucose, UA NEGATIVE NEGATIVE mg/dL   Hgb urine dipstick SMALL (A) NEGATIVE   Bilirubin Urine NEGATIVE NEGATIVE   Ketones, ur  NEGATIVE NEGATIVE mg/dL   Protein, ur NEGATIVE NEGATIVE mg/dL   Nitrite POSITIVE (A) NEGATIVE   Leukocytes,Ua LARGE (A) NEGATIVE   RBC / HPF 6-10 0 - 5 RBC/hpf   WBC, UA >50 (H) 0 - 5 WBC/hpf   Bacteria, UA MANY (A) NONE SEEN   Squamous Epithelial / LPF 6-10 0 - 5    Comment: Performed at [facility], [address] [place], [place] [address]  Urine culture     Status: Abnormal   Collection Time: 05/16/19  1:15 PM   Specimen: Urine, Random  Result Value Ref Range   Specimen Description URINE, RANDOM    Special Requests      SITE NOT SPECIFIED Performed at [facility], [address] [place], [place] [address]    Culture >=100,000 COLONIES/mL ESCHERICHIA COLI (A)    Report Status 05/18/2019 FINAL    Organism ID, Bacteria ESCHERICHIA COLI (A)       Susceptibility   Escherichia coli - MIC*    AMPICILLIN <=2 SENSITIVE Sensitive     CEFAZOLIN <=4 SENSITIVE Sensitive     CEFTRIAXONE <=1 SENSITIVE Sensitive     CIPROFLOXACIN <=0.25 SENSITIVE Sensitive     GENTAMICIN <=1 SENSITIVE Sensitive     IMIPENEM <=0.25 SENSITIVE Sensitive     NITROFURANTOIN <=16 SENSITIVE Sensitive     TRIMETH/SULFA <=20 SENSITIVE Sensitive     AMPICILLIN/SULBACTAM <=2 SENSITIVE Sensitive     PIP/TAZO <=4 SENSITIVE Sensitive     Extended ESBL NEGATIVE Sensitive     * >=100,000 COLONIES/mL ESCHERICHIA COLI    Assessment/Plan:  Chronic kidney disease (CKD), stage III (moderate) - Repeat BMET today - Referral to Nephrology made today - F/u in 4-6 weeks - Stopping Lisinopril due to cough but starting ARB valsartan to continue kidney protective BP control medication.  T2DM (type 2 diabetes mellitus) (HCC) Patient with T2DM not taking daily BG diary. Reports being well controlled on SU nateglinide for a long time. Did not tolerate Metformin and given her CKD III probably not the best choice at this time.  - Start checking BG fasting every morning. - Repeat A1c at next office visit. - Cont  nateglinide for  now but consider SGLT-2 inhibitor for kidney protective benefits at next appointment.  Syncope Syncopal episode most likely due to a combination of orthostatic hypotension, dehydration secondary to diarrhea, and vasovagal episode after straining while on the toilet. No episodes of syncope or near syncope since hospital discharge. - Good oral hydration - Slow down sitting to standing - Stopped Metformin, cont 20mg  Lasix, and daily weights - Referral to Cardiology for recommendations on whether or not to continue Ranexa and see if they recommend any additional syncope workup   UTI (urinary tract infection) Continue Keflex 500mg  QID for 5 more days  Orthostatic hypotension Patient tested positive for orthostatic hypotension while in our office but asymptomatic.  - instructed patient to always get up from seated position slowly and wait a few seconds before she begins walking - stopped metoprolol and cut lasix in half - instructed patient to wear compression stockings, may benefit from abdominal binder if no improvement with medication adjustment - encouraged good oral hydration     PATIENT EDUCATION PROVIDED: See AVS    Diagnosis and plan along with any newly prescribed medication(s) were discussed in detail with this patient today. The patient verbalized understanding and agreed with the plan. Patient advised if symptoms worsen return to clinic or ER.    Orders Placed This Encounter  Procedures  . Basic Metabolic Panel  . Ambulatory referral to Cardiology    Referral Priority:   Routine    Referral Type:   Consultation    Referral Reason:   Specialty Services Required    Requested Specialty:   Cardiology    Number of Visits Requested:   1  . Ambulatory referral to Nephrology    Referral Priority:   Routine    Referral Type:   Consultation    Referral Reason:   Specialty Services Required    Requested Specialty:   Nephrology    Number of Visits Requested:   1     Meds ordered this encounter  Medications  . valsartan (DIOVAN) 40 MG tablet    Sig: Take 1 tablet (40 mg total) by mouth at bedtime.    Dispense:  90 tablet    Refill:  3  . omeprazole (PRILOSEC) 20 MG capsule    Sig: Take 1 capsule (20 mg total) by mouth daily.    Dispense:  30 capsule    Refill:  3     Jules Schickim Bonetta Mostek, DO 05/18/2019, 1:35 PM PGY-3 [facility]

## 2019-05-18 NOTE — Patient Instructions (Addendum)
It was great to meet you today! Thank you for letting me participate in your care!  Today, we discussed your recent hospitalization that was thought to be due to a number of factors such as dehydration, orthostatic hypotension, and vasovagal syncope.   I have referred you to a Cardiologist (heart doc) and a Nephrologist (kidney doc).   I have stopped Lisinopril and started you on Valsartan for your blood pressure. I have also started you on omeprazole for your GERD. For you constipation please start taking Senna daily. You can buy this over the counter.  Be well, Harolyn Rutherford, DO PGY-3, [facility]

## 2019-05-18 NOTE — Assessment & Plan Note (Signed)
Patient with T2DM not taking daily BG diary. Reports being well controlled on SU nateglinide for a long time. Did not tolerate Metformin and given her CKD III probably not the best choice at this time.  - Start checking BG fasting every morning. - Repeat A1c at next office visit. - Cont nateglinide for now but consider SGLT-2 inhibitor for kidney protective benefits at next appointment.

## 2019-05-18 NOTE — Assessment & Plan Note (Signed)
Syncopal episode most likely due to a combination of orthostatic hypotension, dehydration secondary to diarrhea, and vasovagal episode after straining while on the toilet. No episodes of syncope or near syncope since hospital discharge. - Good oral hydration - Slow down sitting to standing - Stopped Metformin, cont 20mg  Lasix, and daily weights - Referral to Cardiology for recommendations on whether or not to continue Ranexa and see if they recommend any additional syncope workup

## 2019-05-18 NOTE — Assessment & Plan Note (Signed)
-   Repeat BMET today - Referral to Nephrology made today - F/u in 4-6 weeks - Stopping Lisinopril due to cough but starting ARB valsartan to continue kidney protective BP control medication.

## 2019-05-19 ENCOUNTER — Telehealth: Payer: Self-pay | Admitting: Emergency Medicine

## 2019-05-19 DIAGNOSIS — I951 Orthostatic hypotension: Secondary | ICD-10-CM | POA: Insufficient documentation

## 2019-05-19 LAB — BASIC METABOLIC PANEL
BUN/Creatinine Ratio: 11 — ABNORMAL LOW (ref 12–28)
BUN: 14 mg/dL (ref 8–27)
CO2: 23 mmol/L (ref 20–29)
Calcium: 9.9 mg/dL (ref 8.7–10.3)
Chloride: 101 mmol/L (ref 96–106)
Creatinine, Ser: 1.27 mg/dL — ABNORMAL HIGH (ref 0.57–1.00)
GFR calc Af Amer: 46 mL/min/{1.73_m2} — ABNORMAL LOW (ref >59)
GFR calc non Af Amer: 40 mL/min/{1.73_m2} — ABNORMAL LOW (ref >59)
Glucose: 128 mg/dL — ABNORMAL HIGH (ref 65–99)
Potassium: 4.5 mmol/L (ref 3.5–5.2)
Sodium: 140 mmol/L (ref 134–144)

## 2019-05-19 NOTE — Telephone Encounter (Signed)
Post ED Visit - Positive Culture Follow-up  Culture report reviewed by antimicrobial stewardship pharmacist: [place] Team []  Elenor Quinones, Pharm.D. []  Heide Guile, Pharm.D., BCPS AQ-ID []  Parks Neptune, Pharm.D., BCPS [x]  Alycia Rossetti, Pharm.D., BCPS []  [place], [place].D., BCPS, AAHIVP []  Legrand Como, Pharm.D., BCPS, AAHIVP []  Salome Arnt, PharmD, BCPS []  Johnnette Gourd, PharmD, BCPS []  Hughes Better, PharmD, BCPS []  Leeroy Cha, PharmD []  Laqueta Linden, PharmD, BCPS []  Albertina Parr, PharmD  [place] Team []  Leodis Sias, PharmD []  Lindell Spar, PharmD []  Royetta Asal, PharmD []  Graylin Shiver, Rph []  Rema Fendt) Glennon Mac, PharmD []  Arlyn Dunning, PharmD []  Netta Cedars, PharmD []  Dia Sitter, PharmD []  Leone Haven, PharmD []  Gretta Arab, PharmD []  Theodis Shove, PharmD []  Peggyann Juba, PharmD []  Reuel Boom, PharmD   Positive urine culture Treated with cephalexin, organism sensitive to the same and no further patient follow-up is required at this time.  Hazle Nordmann 05/19/2019, 10:57 AM

## 2019-05-19 NOTE — Assessment & Plan Note (Signed)
Patient tested positive for orthostatic hypotension while in our office but asymptomatic.  - instructed patient to always get up from seated position slowly and wait a few seconds before she begins walking - stopped metoprolol and cut lasix in half - instructed patient to wear compression stockings, may benefit from abdominal binder if no improvement with medication adjustment - encouraged good oral hydration

## 2019-05-26 ENCOUNTER — Encounter: Payer: Self-pay | Admitting: *Deleted

## 2019-06-16 ENCOUNTER — Encounter: Payer: Self-pay | Admitting: Family Medicine

## 2019-06-16 ENCOUNTER — Other Ambulatory Visit: Payer: Self-pay

## 2019-06-16 ENCOUNTER — Ambulatory Visit (INDEPENDENT_AMBULATORY_CARE_PROVIDER_SITE_OTHER): Payer: Medicare Other | Admitting: Family Medicine

## 2019-06-16 VITALS — BP 130/70 | HR 56 | Ht 61.0 in | Wt 218.1 lb

## 2019-06-16 DIAGNOSIS — E1121 Type 2 diabetes mellitus with diabetic nephropathy: Secondary | ICD-10-CM | POA: Diagnosis not present

## 2019-06-16 DIAGNOSIS — E1122 Type 2 diabetes mellitus with diabetic chronic kidney disease: Secondary | ICD-10-CM

## 2019-06-16 DIAGNOSIS — N1832 Chronic kidney disease, stage 3b: Secondary | ICD-10-CM

## 2019-06-16 DIAGNOSIS — I10 Essential (primary) hypertension: Secondary | ICD-10-CM

## 2019-06-16 LAB — POCT GLYCOSYLATED HEMOGLOBIN (HGB A1C): HbA1c, POC (controlled diabetic range): 5.6 % (ref 0.0–7.0)

## 2019-06-16 NOTE — Progress Notes (Signed)
   Subjective:   Patient ID: Linda Frye, female    DOB: 09/23/1940, 78 y.o.   MRN: [id number]   CC: Follow up blood pressure, DM, syncope   HPI: Patient presenting today for follow up of her HTN, DM, and syncopal episode in Nov 2020. Patient moved to [facility] from [place] in October 2020.   DM: Patient has a history of diabetes for which she is taking Nateglinide TID with meals. Denies any dysuria, urinary frequency, polyuria, or polydipsia. She checks her blood sugars occasionally, primarily when she is feeling shaky or nauseous. Her lowest reported blood sugar is 90. No A1c on file.  This patient would benefit from an SGLT2 antagonist.  HTN: BPs range between 90-180/60-80, with HR between 46 - 68 bpm.  Her more elevated blood pressures were prior to her taking her blood pressure medicines.  She has developed a habit of checking her blood pressure multiple times a day, most likely to many times (upwards of 10 readings daily).  She was recently transitioned from lisinopril to valsartan due to chronic cough, however has continued to take lisinopril instead of starting valsartan (the valsartan prescription was written to be taken at night and this made her nervous to take a medicine before going to bed at night).  The patient's metoprolol was also recently discontinued when she was in the hospital due to low heart rate and concern for its contribution to syncope.  She has not had any more episodes of syncope since her most recent hospitalization.  She denies headaches, chest pain, unusual shortness of breath, abdominal pain, nausea, vomiting, or falls.   Smoking status reviewed - none smoker  Review of Systems - see HPI   Objective:  BP 130/70   Pulse (!) 56   Ht 5\' 1"  (1.549 m)   Wt 218 lb 2 oz (98.9 kg)   SpO2 99%   BMI 41.21 kg/m  Vitals and nursing note reviewed  General: well nourished, in no acute distress Cardiac: RRR, clear S1 and S2, no murmurs appreciated Respiratory: CTA  bilaterally, normal work of breathing Abdomen: soft, normal bowel sounds appreciated Extremities: 1+ nonpitting edema appreciated to bilateral lower extremities with L slightly more than Right. No other deformity or abnormality appreciated Neuro: alert and oriented, no focal deficits  Assessment & Plan:   Benign essential HTN BP Well controlled. Patient is reluctant to stop Lisinopril and metoprolol. Is nervous about her Bps and checks them frequently during the day even if asymptomatic. 1. STOP LISINOPRIL and Metoprolol. Start taking Valsartan daily, can take it every morning.  2. Only take Ranexa (for chest pain) during the day. In 2 weeks only take 1/2 tablet daily (plan to "wean" patient off of this medication, she is nervous how she will do without it).  3. Check blood pressure ONLY 1-2 times daily   T2DM (type 2 diabetes mellitus) ([place]) 1. Check blood sugar once daily and again if symptomatic. Plan to change Nateglinide to Canagliflozin or Empagliflozin at next visit (January 2021).  2. Check A1c at today's appt. Will change mgmt pending result.   Return in about 5 weeks (around 07/21/2019) for BP check, medication change.   Dr. Milus Banister [facility], PGY-2

## 2019-06-16 NOTE — Patient Instructions (Addendum)
Thank you for coming in to see [place] today! Please see below to review our plan for today's visit:  1. STOP LISINOPRIL! Start taking Valsartan. This is an excellent medication to lower blood pressure and protect your kidneys.  2. Only take Ranexa (for chest pain) during the day. Do not take this medication at night. This medication can cause you to feel dizzy when standing up. In 2 weeks only take 1/2 tablet daily.  3. Check your blood pressure ONLY 1-2 times daily! 4. Check your blood sugar once daily. We will change your Nateglinide to Canagliflozin or Empagliflozin at your next visit.  5. NO MORE METOPROLOL!!!!  Please call the clinic at [phone] if your symptoms worsen or you have any concerns. It was our pleasure to serve you!   Dr. Milus Banister [facility]

## 2019-06-18 ENCOUNTER — Ambulatory Visit: Payer: Medicare Other | Admitting: Cardiovascular Disease

## 2019-06-18 DIAGNOSIS — I10 Essential (primary) hypertension: Secondary | ICD-10-CM | POA: Insufficient documentation

## 2019-06-18 NOTE — Assessment & Plan Note (Signed)
BP Well controlled. Patient is reluctant to stop Lisinopril and metoprolol. Is nervous about her Bps and checks them frequently during the day even if asymptomatic. 1. STOP LISINOPRIL and Metoprolol. Start taking Valsartan daily, can take it every morning.  2. Only take Ranexa (for chest pain) during the day. In 2 weeks only take 1/2 tablet daily (plan to "wean" patient off of this medication, she is nervous how she will do without it).  3. Check blood pressure ONLY 1-2 times daily

## 2019-06-18 NOTE — Assessment & Plan Note (Signed)
1. Check blood sugar once daily and again if symptomatic. Plan to change Nateglinide to Canagliflozin or Empagliflozin at next visit (January 2021).  2. Check A1c at today's appt. Will change mgmt pending result.

## 2019-06-22 ENCOUNTER — Other Ambulatory Visit: Payer: Self-pay

## 2019-06-22 ENCOUNTER — Emergency Department (HOSPITAL_COMMUNITY)
Admission: EM | Admit: 2019-06-22 | Discharge: 2019-06-22 | Disposition: A | Discharge status: Home / Self Care | Payer: Medicare Other | Attending: Emergency Medicine | Admitting: Emergency Medicine

## 2019-06-22 ENCOUNTER — Encounter (HOSPITAL_COMMUNITY): Payer: Self-pay | Admitting: Emergency Medicine

## 2019-06-22 DIAGNOSIS — M79604 Pain in right leg: Secondary | ICD-10-CM | POA: Diagnosis not present

## 2019-06-22 DIAGNOSIS — Z5321 Procedure and treatment not carried out due to patient leaving prior to being seen by health care provider: Secondary | ICD-10-CM | POA: Insufficient documentation

## 2019-06-22 DIAGNOSIS — M549 Dorsalgia, unspecified: Secondary | ICD-10-CM | POA: Diagnosis not present

## 2019-06-22 NOTE — ED Notes (Signed)
Pt called for vitals recheck x2 with no answer.  

## 2019-06-22 NOTE — ED Triage Notes (Signed)
Pt. Stated, Ive had rt. Leg pain for 3 days and Im afraid its a blood clot. My back also hurts.

## 2019-07-14 ENCOUNTER — Emergency Department (HOSPITAL_COMMUNITY)
Admission: EM | Admit: 2019-07-14 | Discharge: 2019-07-14 | Discharge status: Against Medical Advice | Payer: Medicare PPO | Attending: Emergency Medicine | Admitting: Emergency Medicine

## 2019-07-14 ENCOUNTER — Other Ambulatory Visit: Payer: Self-pay

## 2019-07-14 ENCOUNTER — Emergency Department (HOSPITAL_COMMUNITY): Payer: Medicare PPO

## 2019-07-14 DIAGNOSIS — Z5321 Procedure and treatment not carried out due to patient leaving prior to being seen by health care provider: Secondary | ICD-10-CM | POA: Diagnosis not present

## 2019-07-14 DIAGNOSIS — R0789 Other chest pain: Secondary | ICD-10-CM | POA: Diagnosis present

## 2019-07-14 LAB — CBC
HCT: 42.8 % (ref 36.0–46.0)
Hemoglobin: 14 g/dL (ref 12.0–15.0)
MCH: 31.5 pg (ref 26.0–34.0)
MCHC: 32.7 g/dL (ref 30.0–36.0)
MCV: 96.4 fL (ref 80.0–100.0)
Platelets: 256 10*3/uL (ref 150–400)
RBC: 4.44 MIL/uL (ref 3.87–5.11)
RDW: 13.7 % (ref 11.5–15.5)
WBC: 7.3 10*3/uL (ref 4.0–10.5)
nRBC: 0 % (ref 0.0–0.2)

## 2019-07-14 LAB — BASIC METABOLIC PANEL
Anion gap: 11 (ref 5–15)
BUN: 11 mg/dL (ref 8–23)
CO2: 25 mmol/L (ref 22–32)
Calcium: 9.5 mg/dL (ref 8.9–10.3)
Chloride: 105 mmol/L (ref 98–111)
Creatinine, Ser: 1.45 mg/dL — ABNORMAL HIGH (ref 0.44–1.00)
GFR calc Af Amer: 40 mL/min — ABNORMAL LOW (ref >60)
GFR calc non Af Amer: 34 mL/min — ABNORMAL LOW (ref >60)
Glucose, Bld: 137 mg/dL — ABNORMAL HIGH (ref 70–99)
Potassium: 4.1 mmol/L (ref 3.5–5.1)
Sodium: 141 mmol/L (ref 135–145)

## 2019-07-14 LAB — TROPONIN I (HIGH SENSITIVITY): Troponin I (High Sensitivity): 6 ng/L (ref <18)

## 2019-07-14 MED ORDER — SODIUM CHLORIDE 0.9% FLUSH: 3.0000 mL | Freq: Once | INTRAVENOUS | Status: DC

## 2019-07-14 NOTE — ED Triage Notes (Signed)
Pt here for evaluation of two episodes of aching, L sided chest pain onset at rest, one episode today, one yesterday. Denies pain at present. Denies shob, n/v.

## 2019-07-14 NOTE — ED Notes (Signed)
Pt stated she was leaving and has appt with PCP this week or next

## 2019-07-30 ENCOUNTER — Ambulatory Visit (INDEPENDENT_AMBULATORY_CARE_PROVIDER_SITE_OTHER): Payer: Medicare PPO | Admitting: Cardiology

## 2019-07-30 ENCOUNTER — Encounter: Payer: Self-pay | Admitting: Cardiology

## 2019-07-30 ENCOUNTER — Other Ambulatory Visit: Payer: Self-pay

## 2019-07-30 VITALS — BP 117/83 | HR 77 | Temp 97.3°F | Ht 62.0 in | Wt 217.0 lb

## 2019-07-30 DIAGNOSIS — I10 Essential (primary) hypertension: Secondary | ICD-10-CM | POA: Diagnosis not present

## 2019-07-30 DIAGNOSIS — R0789 Other chest pain: Secondary | ICD-10-CM

## 2019-07-30 DIAGNOSIS — R55 Syncope and collapse: Secondary | ICD-10-CM | POA: Diagnosis not present

## 2019-07-30 DIAGNOSIS — Z7189 Other specified counseling: Secondary | ICD-10-CM

## 2019-07-30 NOTE — Progress Notes (Signed)
Cardiology Office Note:    Date:  07/30/2019   ID:  Linda Frye, DOB May 14, 1941, MRN [id number]  PCP:  Linda Ebbs, MD  Cardiologist:  Linda Dresser, MD  Referring MD: Linda Floro, DO   CC: new patient evaluation for syncope  History of Present Illness:    Linda Frye is a 79 y.o. female with a hx of type II diabetes, hypertension, CAD who is seen as a new consult at the request of Linda Floro, DO for the evaluation and management of hypertension and syncope.  Note from visit 06/16/19 reviewed (Dr. Ouida Sills, [place]). Noted at that time to have good blood pressure control. She was changed from lisinopril to valsartan at that visit. Metoprolol had been stopped during hospitalization for syncope. Also planned to wean from ranexa.  Today: Reviewed ER visit from 05/2019. She tells me this is the only time she has ever lost consciousness. She was told she was dehydrated. She feels she does a good job with hydration, and BP numbers on orthostatics today are normal. She was have diarrhea and is on a diuretic at baseline, so she thinks maybe she was not doing as well at the time as she is now. No more issues.   Has a blood pressure machine at home, difficult for her to use the cuff herself. Reviewed types of cuff. I prefer arm cuff to wrist cuff, discussed the sleeves vs. Having to wrap/tighten the cuff. She will look into this.  Has rare fleeting chest pain, stinging pain, moves to breast from upper abdomen. Is happening much less frequently now. She attributes it to gallstones and hiatal hernia, says she has had lots of tests to look at this. Nonexertional.   When her blood pressure is "extremely high," she still takes a metoprolol periodically. One day she had a reading of 190/119. She lives with daughter, and there is some stress with this situation. Had also been watching stressful TV, and she knows this raises her blood pressure. Husband died  20 mos ago, and this has been hard for her.  Denies chest pain, shortness of breath at rest or with normal exertion. No PND, orthopnea, or unexpected weight gain. No new syncope or palpitations. Has intermittent LE and pain.   Past Medical History:  Diagnosis Date  . Acute carpal tunnel syndrome    bilateral wrists  . Arthritis   . Coronary artery disease   . Diabetes mellitus without complication ([place])   . Diverticulitis   . Hypertension   . 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  Current Medications: Current Outpatient Medications on File Prior to Visit  Medication Sig  . aspirin EC 81 MG tablet Take 81 mg by mouth daily.  . cephALEXin (KEFLEX) 500 MG capsule Take 1 capsule (500 mg total) by mouth 4 (four) times daily.  . cholecalciferol (VITAMIN D3) 25 MCG (1000 UT) tablet Take 1,000 Units by mouth daily.  . furosemide (LASIX) 20 MG tablet Take 1 tablet (20 mg total) by mouth daily.  . nateglinide (STARLIX) 120 MG tablet Take 120 mg by mouth 3 (three) times daily with meals.  Marland Kitchen omeprazole (PRILOSEC) 20 MG capsule Take 1 capsule (20 mg total) by mouth daily.  . paricalcitol (ZEMPLAR) 1 MCG capsule Take 1 mcg by mouth daily.  . simvastatin (ZOCOR) 40 MG tablet Take 40 mg by mouth daily.  Marland Kitchen tetrahydrozoline 0.05 % ophthalmic solution Place 1 drop  into both eyes daily as needed (dry eyes).  . valsartan (DIOVAN) 40 MG tablet Take 1 tablet (40 mg total) by mouth at bedtime.  . vitamin B-12 (CYANOCOBALAMIN) 500 MCG tablet Take 500 mcg by mouth daily.  . [DISCONTINUED] metoprolol tartrate (LOPRESSOR) 25 MG tablet Take 12.5 mg by mouth as needed (for blood pressure >140).    No current facility-administered medications on file prior to visit.     Allergies:   Patient has no known allergies.   Social History   Tobacco Use  . Smoking status: Never Smoker  . Smokeless tobacco: Never Used  Substance Use Topics  . Alcohol use: Not Currently  .  Drug use: Never    Family History: Father had a stroke, passed away age 89. No other heart disease or CVA that she knows of.  ROS:   Please see the history of present illness.  Additional pertinent ROS: Constitutional: Negative for chills, fever, night sweats, unintentional weight loss  HENT: Negative for ear pain and hearing loss.   Eyes: Negative for loss of vision and eye pain.  Respiratory: Negative for cough, sputum, wheezing.   Cardiovascular: See HPI. Gastrointestinal: Negative for abdominal pain, melena, and hematochezia.  Genitourinary: Negative for dysuria and hematuria.  Musculoskeletal: Negative for falls and myalgias.  Skin: Negative for itching and rash.  Neurological: Negative for focal weakness, focal sensory changes and loss of consciousness.  Endo/Heme/Allergies: Does not bruise/bleed easily.     EKGs/Labs/Other Studies Reviewed:    The following studies were reviewed today: No cardiac studies  EKG:  EKG is personally reviewed.  The ekg ordered today demonstrates NSR  Recent Labs: 05/16/2019: ALT 9 07/14/2019: BUN 11; Creatinine, Ser 1.45; Hemoglobin 14.0; Platelets 256; Potassium 4.1; Sodium 141  Recent Lipid Panel No results found for: CHOL, TRIG, HDL, CHOLHDL, VLDL, LDLCALC, LDLDIRECT  Physical Exam:    VS:  BP 117/83   Pulse 77   Temp (!) 97.3 F (36.3 C)   Ht 5\' 2"  (1.575 m)   Wt 217 lb (98.4 kg)   SpO2 100%   BMI 39.69 kg/m    Orthostatic VS for the past 24 hrs (Last 3 readings):  BP- Lying Pulse- Lying BP- Sitting Pulse- Sitting BP- Standing at 0 minutes Pulse- Standing at 0 minutes BP- Standing at 3 minutes Pulse- Standing at 3 minutes  07/30/19 1113 117/76 72 117/83 77 116/70 77 119/74 80    Wt Readings from Last 3 Encounters:  07/30/19 217 lb (98.4 kg)  06/16/19 218 lb 2 oz (98.9 kg)  05/18/19 216 lb 6.4 oz (98.2 kg)    GEN: Well nourished, well developed in no acute distress HEENT: Normal, moist mucous membranes NECK: No JVD CARDIAC:  regular rhythm, normal S1 and S2, no rubs or gallops. No murmurs. VASCULAR: Radial and DP pulses 2+ bilaterally. No carotid bruits RESPIRATORY:  Clear to auscultation without rales, wheezing or rhonchi  ABDOMEN: Soft, non-tender, non-distended MUSCULOSKELETAL:  Ambulates independently SKIN: Warm and dry, no edema NEUROLOGIC:  Alert and oriented x 3. No focal neuro deficits noted. PSYCHIATRIC:  Normal affect    ASSESSMENT:    1. Syncope, unspecified syncope type   2. Essential hypertension   3. Cardiac risk counseling   4. Counseling on health promotion and disease prevention   5. Atypical chest pain    PLAN:    Syncope: -orthostatics normal today -ECG normal today -has not had echo. Will order this today to complete workup  Hypertension: excellent control today. Reports occasional  elevation, but this is rare. -continue furosemide 20 mg, valsartan 40 mg  Atypical chest pain: she tells me this has been worked up extensively in the past and attributed to hiatal hernia and gallstones. I do not have records for this -discussed both accelerating and red flag signs, when she should call me and when she should call 911 -she is now off the ranexa, and this did not help her. Removed from med list today  Cardiac risk counseling and prevention recommendations: -recommend heart healthy/Mediterranean diet, with whole grains, fruits, vegetable, fish, lean meats, nuts, and olive oil. Limit salt. -recommend moderate walking, 3-5 times/week for 30-50 minutes each session. Aim for at least 150 minutes.week. Goal should be pace of 3 miles/hours, or walking 1.5 miles in 30 minutes -recommend avoidance of tobacco products. Avoid excess alcohol. -on aspirin and simvastatin. She is >75 but as she is tolerating this and has diabetes, will continue  Plan for follow up: if echo normal, 1 year for follow up  Medication Adjustments/Labs and Tests Ordered: Current medicines are reviewed at length with the  patient today.  Concerns regarding medicines are outlined above.  Orders Placed This Encounter  Procedures  . EKG 12-Lead  . ECHOCARDIOGRAM COMPLETE   No orders of the defined types were placed in this encounter.   Patient Instructions  Medication Instructions:  Your physician recommends that you continue on your current medications as directed. Please refer to the Current Medication list given to you today. *If you need a refill on your cardiac medications before your next appointment, please call your pharmacy*  Lab Work: None  If you have labs (blood work) drawn today and your tests are completely normal, you will receive your results only by: Marland Kitchen MyChart Message (if you have MyChart) OR . A paper copy in the mail If you have any lab test that is abnormal or we need to change your treatment, we will call you to review the results.  Testing/Procedures: Your physician has requested that you have an echocardiogram. Echocardiography is a painless test that uses sound waves to create images of your heart. It provides your doctor with information about the size and shape of your heart and how well your heart's chambers and valves are working. This procedure takes approximately one hour. There are no restrictions for this procedure. TEST WILL BE COMPLETED AT [address]  Follow-Up: At [facility], you and your health needs are our priority.  As part of our continuing mission to provide you with exceptional heart care, we have created designated Provider Care Teams.  These Care Teams include your primary Cardiologist (physician) and Advanced Practice Providers (APPs -  Physician Assistants and Nurse Practitioners) who all work together to provide you with the care you need, when you need it.  Your next appointment:   12 month(s)  The format for your next appointment:   In Person  Provider:   Jodelle Red, MD  Other Instructions    Signed, Jodelle Red, MD PhD 07/30/2019 1:12 PM    [facility]

## 2019-07-30 NOTE — Patient Instructions (Signed)
Medication Instructions:  Your physician recommends that you continue on your current medications as directed. Please refer to the Current Medication list given to you today. *If you need a refill on your cardiac medications before your next appointment, please call your pharmacy*  Lab Work: None  If you have labs (blood work) drawn today and your tests are completely normal, you will receive your results only by: Marland Kitchen MyChart Message (if you have MyChart) OR . A paper copy in the mail If you have any lab test that is abnormal or we need to change your treatment, we will call you to review the results.  Testing/Procedures: Your physician has requested that you have an echocardiogram. Echocardiography is a painless test that uses sound waves to create images of your heart. It provides your doctor with information about the size and shape of your heart and how well your heart's chambers and valves are working. This procedure takes approximately one hour. There are no restrictions for this procedure. TEST WILL BE COMPLETED AT [address]  Follow-Up: At [facility], you and your health needs are our priority.  As part of our continuing mission to provide you with exceptional heart care, we have created designated Provider Care Teams.  These Care Teams include your primary Cardiologist (physician) and Advanced Practice Providers (APPs -  Physician Assistants and Nurse Practitioners) who all work together to provide you with the care you need, when you need it.  Your next appointment:   12 month(s)  The format for your next appointment:   In Person  Provider:   Jodelle Red, MD  Other Instructions

## 2019-08-12 ENCOUNTER — Ambulatory Visit (HOSPITAL_COMMUNITY): Payer: Medicare PPO | Attending: Cardiology

## 2019-08-12 DIAGNOSIS — I10 Essential (primary) hypertension: Secondary | ICD-10-CM

## 2019-08-12 DIAGNOSIS — R55 Syncope and collapse: Secondary | ICD-10-CM | POA: Diagnosis not present

## 2019-08-12 DIAGNOSIS — Z7189 Other specified counseling: Secondary | ICD-10-CM

## 2019-09-11 ENCOUNTER — Telehealth (HOSPITAL_COMMUNITY): Payer: Self-pay | Admitting: *Deleted

## 2019-09-11 NOTE — Telephone Encounter (Signed)
Patient requests I call back later this afternoon.

## 2019-09-16 ENCOUNTER — Other Ambulatory Visit: Payer: Self-pay

## 2019-09-16 ENCOUNTER — Ambulatory Visit (HOSPITAL_COMMUNITY)
Admission: RE | Admit: 2019-09-16 | Discharge: 2019-09-16 | Disposition: A | Discharge status: Home / Self Care | Payer: Medicare PPO | Source: Ambulatory Visit | Attending: Vascular Surgery | Admitting: Vascular Surgery

## 2019-09-16 ENCOUNTER — Other Ambulatory Visit (HOSPITAL_COMMUNITY): Payer: Self-pay | Admitting: Internal Medicine

## 2019-09-16 DIAGNOSIS — I739 Peripheral vascular disease, unspecified: Secondary | ICD-10-CM | POA: Insufficient documentation

## 2019-10-26 ENCOUNTER — Other Ambulatory Visit: Payer: Self-pay | Admitting: Internal Medicine

## 2019-10-26 DIAGNOSIS — Z1231 Encounter for screening mammogram for malignant neoplasm of breast: Secondary | ICD-10-CM

## 2019-10-29 ENCOUNTER — Other Ambulatory Visit: Payer: Self-pay | Admitting: Internal Medicine

## 2019-10-29 DIAGNOSIS — E2839 Other primary ovarian failure: Secondary | ICD-10-CM

## 2019-10-30 ENCOUNTER — Other Ambulatory Visit: Payer: Self-pay

## 2019-10-30 ENCOUNTER — Ambulatory Visit
Admission: RE | Admit: 2019-10-30 | Discharge: 2019-10-30 | Disposition: A | Discharge status: Home / Self Care | Payer: Medicare PPO | Source: Ambulatory Visit | Attending: Internal Medicine | Admitting: Internal Medicine

## 2019-10-30 DIAGNOSIS — E2839 Other primary ovarian failure: Secondary | ICD-10-CM

## 2019-11-13 ENCOUNTER — Emergency Department (HOSPITAL_COMMUNITY)
Admission: EM | Admit: 2019-11-13 | Discharge: 2019-11-13 | Disposition: A | Discharge status: Home / Self Care | Payer: Medicare PPO | Attending: Emergency Medicine | Admitting: Emergency Medicine

## 2019-11-13 ENCOUNTER — Encounter (HOSPITAL_COMMUNITY): Payer: Self-pay | Admitting: Emergency Medicine

## 2019-11-13 ENCOUNTER — Other Ambulatory Visit: Payer: Self-pay

## 2019-11-13 DIAGNOSIS — Z7982 Long term (current) use of aspirin: Secondary | ICD-10-CM | POA: Insufficient documentation

## 2019-11-13 DIAGNOSIS — I158 Other secondary hypertension: Secondary | ICD-10-CM | POA: Diagnosis not present

## 2019-11-13 DIAGNOSIS — I2510 Atherosclerotic heart disease of native coronary artery without angina pectoris: Secondary | ICD-10-CM | POA: Insufficient documentation

## 2019-11-13 DIAGNOSIS — Z79899 Other long term (current) drug therapy: Secondary | ICD-10-CM | POA: Diagnosis not present

## 2019-11-13 DIAGNOSIS — R030 Elevated blood-pressure reading, without diagnosis of hypertension: Secondary | ICD-10-CM | POA: Diagnosis present

## 2019-11-13 DIAGNOSIS — N183 Chronic kidney disease, stage 3 unspecified: Secondary | ICD-10-CM | POA: Diagnosis not present

## 2019-11-13 DIAGNOSIS — E1122 Type 2 diabetes mellitus with diabetic chronic kidney disease: Secondary | ICD-10-CM | POA: Diagnosis not present

## 2019-11-13 DIAGNOSIS — Z7984 Long term (current) use of oral hypoglycemic drugs: Secondary | ICD-10-CM | POA: Insufficient documentation

## 2019-11-13 LAB — CBC
HCT: 42.4 % (ref 36.0–46.0)
Hemoglobin: 13.7 g/dL (ref 12.0–15.0)
MCH: 29.8 pg (ref 26.0–34.0)
MCHC: 32.3 g/dL (ref 30.0–36.0)
MCV: 92.2 fL (ref 80.0–100.0)
Platelets: 262 10*3/uL (ref 150–400)
RBC: 4.6 MIL/uL (ref 3.87–5.11)
RDW: 14.6 % (ref 11.5–15.5)
WBC: 7.5 10*3/uL (ref 4.0–10.5)
nRBC: 0 % (ref 0.0–0.2)

## 2019-11-13 LAB — BASIC METABOLIC PANEL
Anion gap: 11 (ref 5–15)
BUN: 12 mg/dL (ref 8–23)
CO2: 27 mmol/L (ref 22–32)
Calcium: 9.6 mg/dL (ref 8.9–10.3)
Chloride: 102 mmol/L (ref 98–111)
Creatinine, Ser: 1.19 mg/dL — ABNORMAL HIGH (ref 0.44–1.00)
GFR calc Af Amer: 50 mL/min — ABNORMAL LOW (ref >60)
GFR calc non Af Amer: 43 mL/min — ABNORMAL LOW (ref >60)
Glucose, Bld: 129 mg/dL — ABNORMAL HIGH (ref 70–99)
Potassium: 4.4 mmol/L (ref 3.5–5.1)
Sodium: 140 mmol/L (ref 135–145)

## 2019-11-13 LAB — URINALYSIS, ROUTINE W REFLEX MICROSCOPIC
Bilirubin Urine: NEGATIVE
Glucose, UA: NEGATIVE mg/dL
Hgb urine dipstick: NEGATIVE
Ketones, ur: NEGATIVE mg/dL
Leukocytes,Ua: NEGATIVE
Nitrite: NEGATIVE
Protein, ur: NEGATIVE mg/dL
Specific Gravity, Urine: 1.01 (ref 1.005–1.030)
pH: 6 (ref 5.0–8.0)

## 2019-11-13 MED ORDER — SODIUM CHLORIDE 0.9% FLUSH: 3.0000 mL | Freq: Once | INTRAVENOUS | Status: DC

## 2019-11-13 NOTE — ED Triage Notes (Signed)
Pt reports her BP was high at home (188/88), states she takes her BP meds at night. Denies shortness of breath and chest pain. Reports intermittent dizziness and headaches.

## 2019-11-13 NOTE — ED Provider Notes (Signed)
[facility] EMERGENCY DEPARTMENT Provider Note  CSN: [id number] Arrival date & time: 11/13/19 0141  Chief Complaint(s) Hypertension  HPI Linda Frye is a 79 y.o. female with a past medical history listed below including hypertension.  She presents today for elevated blood pressures noted this evening with systolics in the 180s.  Patient is asymptomatic denying any associated chest pain or shortness of breath.  She does report having intermittent migrating headaches.  Currently mild at this time.  No alleviating or aggravating factors.  No recent fevers or infections.  No nausea or vomiting.  No abdominal pain.  Patient does have peripheral edema which is being evaluated by her primary care provider.  She reports that her typical blood pressures are in the 130s.  HPI  Past Medical History Past Medical History:  Diagnosis Date  . Acute carpal tunnel syndrome    bilateral wrists  . Arthritis   . Coronary artery disease   . Diabetes mellitus without complication (HCC)   . Diverticulitis   . Hypertension   . Renal disorder    kidneys 35% function   Patient Active Problem List   Diagnosis Date Noted  . Benign essential HTN 06/18/2019  . Orthostatic hypotension 05/19/2019  . Chronic kidney disease (CKD), stage III (moderate) 05/18/2019  . T2DM (type 2 diabetes mellitus) (HCC) 05/18/2019  . Syncope 05/18/2019  . UTI (urinary tract infection) 05/18/2019   Home Medication(s) Prior to Admission medications   Medication Sig Start Date End Date Taking? Authorizing Provider  acetaminophen (TYLENOL) 500 MG tablet Take 500 mg by mouth daily as needed for headache.   Yes [provider]  aspirin EC 81 MG tablet Take 81 mg by mouth daily.   Yes [provider]  cholecalciferol (VITAMIN D3) 25 MCG (1000 UT) tablet Take 1,000 Units by mouth daily.   Yes [provider]  furosemide (LASIX) 20 MG tablet Take 1 tablet (20 mg total) by mouth daily.  05/16/19  Yes Jacalyn Lefevre, MD  nateglinide (STARLIX) 120 MG tablet Take 120 mg by mouth 3 (three) times daily with meals.   Yes [provider]  nitroGLYCERIN (NITROSTAT) 0.4 MG SL tablet Place 1 tablet under the tongue every 5 (five) minutes as needed for chest pain. 07/24/19  Yes [provider]  omeprazole (PRILOSEC) 20 MG capsule Take 1 capsule (20 mg total) by mouth daily. Patient taking differently: Take 20 mg by mouth daily as needed (Acid reflux).  05/18/19  Yes Lockamy, Timothy, DO  paricalcitol (ZEMPLAR) 1 MCG capsule Take 1 mcg by mouth daily.   Yes [provider]  sertraline (ZOLOFT) 25 MG tablet Take 25 mg by mouth daily. 10/22/19  Yes [provider]  simvastatin (ZOCOR) 40 MG tablet Take 40 mg by mouth daily.   Yes [provider]  tetrahydrozoline 0.05 % ophthalmic solution Place 1 drop into both eyes daily as needed (dry eyes).   Yes [provider]  valsartan (DIOVAN) 40 MG tablet Take 1 tablet (40 mg total) by mouth at bedtime. 05/18/19  Yes Lockamy, Marcial Pacas, DO  vitamin B-12 (CYANOCOBALAMIN) 500 MCG tablet Take 500 mcg by mouth daily.   Yes [provider]  cephALEXin (KEFLEX) 500 MG capsule Take 1 capsule (500 mg total) by mouth 4 (four) times daily. Patient not taking: Reported on 11/13/2019 05/16/19   Jacalyn Lefevre, MD  metoprolol tartrate (LOPRESSOR) 25 MG tablet Take 12.5 mg by mouth as needed (for blood pressure >140).   05/16/19  [provider]                                                                                                                                    Past Surgical History History reviewed. No pertinent surgical history. Family History No family history on file.  Social History Social History   Tobacco Use  . Smoking status: Never Smoker  . Smokeless tobacco: Never Used  Substance Use Topics  . Alcohol use: Not Currently  . Drug use: Never    Allergies Epinephrine  Review of Systems Review of Systems All other systems are reviewed and are negative for acute change except as noted in the HPI  Physical Exam Vital Signs  I have reviewed the triage vital signs BP (!) 133/53 (BP Location: Right Arm)   Pulse 60   Temp 98.4 F (36.9 C) (Oral)   Resp 18   SpO2 99%   Physical Exam Vitals reviewed.  Constitutional:      General: She is not in acute distress.    Appearance: She is well-developed. She is obese. She is not diaphoretic.  HENT:     Head: Normocephalic and atraumatic.     Nose: Nose normal.  Eyes:     General: No scleral icterus.       Right eye: No discharge.        Left eye: No discharge.     Conjunctiva/sclera: Conjunctivae normal.     Pupils: Pupils are equal, round, and reactive to light.  Cardiovascular:     Rate and Rhythm: Normal rate and regular rhythm.     Heart sounds: No murmur. No friction rub. No gallop.   Pulmonary:     Effort: Pulmonary effort is normal. No respiratory distress.     Breath sounds: Normal breath sounds. No stridor. No rales.  Abdominal:     General: There is no distension.     Palpations: Abdomen is soft.     Tenderness: There is no abdominal tenderness.  Musculoskeletal:        General: No tenderness.     Cervical back: Normal range of motion and neck supple.     Right lower leg: 1+ Pitting Edema present.     Left lower leg: 1+ Pitting Edema present.  Skin:    General: Skin is warm and dry.     Findings: No erythema or rash.  Neurological:     Mental Status: She is alert and oriented to person, place, and time.     ED Results and Treatments Labs (all labs ordered are listed, but only abnormal results are displayed) Labs Reviewed  BASIC METABOLIC PANEL - Abnormal; Notable for the following components:      Result Value   Glucose, Bld 129 (*)    Creatinine, Ser 1.19 (*)    GFR calc non Af Amer 43 (*)    GFR calc Af Amer 50 (*)  All other components  within normal limits  URINALYSIS, ROUTINE W REFLEX MICROSCOPIC - Abnormal; Notable for the following components:   APPearance HAZY (*)    All other components within normal limits  CBC                                                                                                                         EKG  EKG Interpretation  Date/Time:  Friday Nov 13 2019 01:48:55 EDT Ventricular Rate:  76 PR Interval:  116 QRS Duration: 64 QT Interval:  366 QTC Calculation: 411 R Axis:   -24 Text Interpretation: Unusual P axis, possible ectopic atrial rhythm Abnormal ECG When compared with ECG of 07/14/2019, No significant change was found Reconfirmed by Drema Pry [phone]) on 11/13/2019 4:36:52 AM      Radiology No results found.  Pertinent labs & imaging results that were available during my care of the patient were reviewed by me and considered in my medical decision making (see chart for details).  Medications Ordered in ED Medications  sodium chloride flush (NS) 0.9 % injection 3 mL (has no administration in time range)                                                                                                                                    Procedures Procedures  (including critical care time)  Medical Decision Making / ED Course I have reviewed the nursing notes for this encounter and the patient's prior records (if available in EHR or on provided paperwork).   Aamirah Salmi was evaluated in Emergency Department on 11/13/2019 for the symptoms described in the history of present illness. She was evaluated in the context of the global COVID-19 pandemic, which necessitated consideration that the patient might be at risk for infection with the SARS-CoV-2 virus that causes COVID-19. Institutional protocols and algorithms that pertain to the evaluation of patients at risk for COVID-19 are in a state of rapid change based on information released by regulatory bodies including the CDC  and federal and state organizations. These policies and algorithms were followed during the patient's care in the ED.  Asymptomatic hypertension.  Patient's blood pressures appear to be improving since she has been here.  Trending down from the 150s down to the 130s without intervention.  She remains asymptomatic.  Labs drawn in triage are grossly reassuring, revealing improved renal insufficiency.  No electrolyte  derangements.  No anemia or leukocytosis.  EKG without acute ischemic changes.  Patient has appointment with her PCP in the morning.  Recommended discussing her blood pressure medication regimen at that time.      Final Clinical Impression(s) / ED Diagnoses Final diagnoses:  Other secondary hypertension    The patient appears reasonably screened and/or stabilized for discharge and I doubt any other medical condition or other [facility] requiring further screening, evaluation, or treatment in the ED at this time prior to discharge. Safe for discharge with strict return precautions.  Disposition: Discharge  Condition: Good  I have discussed the results, Dx and Tx plan with the patient/family who expressed understanding and agree(s) with the plan. Discharge instructions discussed at length. The patient/family was given strict return precautions who verbalized understanding of the instructions. No further questions at time of discharge.    ED Discharge Orders    None       Follow Up: Nolene Ebbs, MD [place] [address] [phone]  Today      This chart was dictated using voice recognition software.  Despite best efforts to proofread,  errors can occur which can change the documentation meaning.   Fatima Blank, MD 11/13/19 [phone]

## 2019-11-18 ENCOUNTER — Ambulatory Visit
Admission: RE | Admit: 2019-11-18 | Discharge: 2019-11-18 | Disposition: A | Discharge status: Home / Self Care | Payer: Medicare PPO | Source: Ambulatory Visit | Attending: Internal Medicine | Admitting: Internal Medicine

## 2019-11-18 ENCOUNTER — Other Ambulatory Visit: Payer: Self-pay

## 2019-11-18 DIAGNOSIS — Z1231 Encounter for screening mammogram for malignant neoplasm of breast: Secondary | ICD-10-CM

## 2019-11-24 ENCOUNTER — Other Ambulatory Visit: Payer: Self-pay | Admitting: *Deleted

## 2019-11-24 DIAGNOSIS — I739 Peripheral vascular disease, unspecified: Secondary | ICD-10-CM

## 2019-11-25 ENCOUNTER — Telehealth (HOSPITAL_COMMUNITY): Payer: Self-pay

## 2019-11-25 NOTE — Telephone Encounter (Signed)

## 2019-11-26 ENCOUNTER — Encounter: Payer: Medicare PPO | Admitting: Vascular Surgery

## 2019-11-26 ENCOUNTER — Ambulatory Visit: Payer: Medicare PPO | Admitting: Vascular Surgery

## 2019-11-26 ENCOUNTER — Other Ambulatory Visit: Payer: Self-pay

## 2019-11-26 ENCOUNTER — Ambulatory Visit (HOSPITAL_COMMUNITY)
Admission: RE | Admit: 2019-11-26 | Discharge: 2019-11-26 | Disposition: A | Discharge status: Home / Self Care | Payer: Medicare PPO | Source: Ambulatory Visit | Attending: Vascular Surgery | Admitting: Vascular Surgery

## 2019-11-26 ENCOUNTER — Encounter: Payer: Self-pay | Admitting: Vascular Surgery

## 2019-11-26 ENCOUNTER — Encounter (HOSPITAL_COMMUNITY): Payer: Medicare PPO

## 2019-11-26 VITALS — BP 159/76 | HR 62 | Temp 97.3°F | Resp 20 | Ht 62.0 in | Wt 219.0 lb

## 2019-11-26 DIAGNOSIS — M7989 Other specified soft tissue disorders: Secondary | ICD-10-CM | POA: Diagnosis not present

## 2019-11-26 DIAGNOSIS — I739 Peripheral vascular disease, unspecified: Secondary | ICD-10-CM | POA: Diagnosis not present

## 2019-11-26 NOTE — Progress Notes (Signed)
Referring Physician: Fleet Contras  Patient name: Linda Frye MRN: [id number] DOB: 24-Nov-1940 Sex: female  REASON FOR CONSULT: Leg swelling  HPI: Linda Frye is a 79 y.o. female, who complains of swelling in both legs but the left is worse than the right.  She states she gets a heavy sensation in the left leg at times and pain that extends down the calf.  Sometimes she has to lift her left leg into the car.  Symptoms have been present for about a year.  She has no prior history of stroke or DVT.  She does have a history of diabetes and does have some numbness and tingling in her feet.  She has no history of renal dysfunction a recent serum creatinine was 1.1.  Other medical problems include obesity, arthritis, coronary artery disease, diabetes, hypertension all of which are currently stable.  Past Medical History:  Diagnosis Date  . Acute carpal tunnel syndrome    bilateral wrists  . Arthritis   . Coronary artery disease   . Diabetes mellitus without complication (HCC)   . Diverticulitis   . Hypertension   . Renal disorder    kidneys 35% function   History reviewed. No pertinent surgical history.  Family History  Problem Relation Age of Onset  . Breast cancer Sister     SOCIAL HISTORY: Social History   Socioeconomic History  . Marital status: Widowed    Spouse name: Not on file  . Number of children: Not on file  . Years of education: Not on file  . Highest education level: Not on file  Occupational History  . Not on file  Tobacco Use  . Smoking status: Never Smoker  . Smokeless tobacco: Never Used  Substance and Sexual Activity  . Alcohol use: Not Currently  . Drug use: Never  . Sexual activity: Not Currently  Other Topics Concern  . Not on file  Social History Narrative  . Not on file   Social Determinants of Health   Financial Resource Strain:   . Difficulty of Paying Living Expenses:   Food Insecurity:   . Worried About Programme researcher, broadcasting/film/video in  the Last Year:   . Barista in the Last Year:   Transportation Needs:   . Freight forwarder (Medical):   Marland Kitchen Lack of Transportation (Non-Medical):   Physical Activity:   . Days of Exercise per Week:   . Minutes of Exercise per Session:   Stress:   . Feeling of Stress :   Social Connections:   . Frequency of Communication with Friends and Family:   . Frequency of Social Gatherings with Friends and Family:   . Attends Religious Services:   . Active Member of Clubs or Organizations:   . Attends Banker Meetings:   Marland Kitchen Marital Status:   Intimate Partner Violence:   . Fear of Current or Ex-Partner:   . Emotionally Abused:   Marland Kitchen Physically Abused:   . Sexually Abused:     Allergies  Allergen Reactions  . Epinephrine Other (See Comments) and Palpitations    "heart started racing so fast I passed out"     Current Outpatient Medications  Medication Sig Dispense Refill  . acetaminophen (TYLENOL) 500 MG tablet Take 500 mg by mouth daily as needed for headache.    Marland Kitchen aspirin EC 81 MG tablet Take 81 mg by mouth daily.    . cholecalciferol (VITAMIN D3) 25 MCG (1000 UT) tablet Take  1,000 Units by mouth daily.    . furosemide (LASIX) 20 MG tablet Take 1 tablet (20 mg total) by mouth daily. 30 tablet 0  . nateglinide (STARLIX) 120 MG tablet Take 120 mg by mouth 3 (three) times daily with meals.    . nitroGLYCERIN (NITROSTAT) 0.4 MG SL tablet Place 1 tablet under the tongue every 5 (five) minutes as needed for chest pain.    Marland Kitchen omeprazole (PRILOSEC) 20 MG capsule Take 1 capsule (20 mg total) by mouth daily. (Patient taking differently: Take 20 mg by mouth daily as needed (Acid reflux). ) 30 capsule 3  . paricalcitol (ZEMPLAR) 1 MCG capsule Take 1 mcg by mouth daily.    . sertraline (ZOLOFT) 25 MG tablet Take 25 mg by mouth daily.    . simvastatin (ZOCOR) 40 MG tablet Take 40 mg by mouth daily.    Marland Kitchen tetrahydrozoline 0.05 % ophthalmic solution Place 1 drop into both eyes daily  as needed (dry eyes).    . valsartan (DIOVAN) 40 MG tablet Take 1 tablet (40 mg total) by mouth at bedtime. 90 tablet 3  . vitamin B-12 (CYANOCOBALAMIN) 500 MCG tablet Take 500 mcg by mouth daily.     No current facility-administered medications for this visit.    ROS:   General:  No weight loss, Fever, chills  HEENT: No recent headaches, no nasal bleeding, no visual changes, no sore throat  Neurologic: No dizziness, blackouts, seizures. No recent symptoms of stroke or mini- stroke. No recent episodes of slurred speech, or temporary blindness.  Cardiac: No recent episodes of chest pain/pressure, no shortness of breath at rest.  No shortness of breath with exertion.  Denies history of atrial fibrillation or irregular heartbeat  Vascular: No history of rest pain in feet.  No history of claudication.  No history of non-healing ulcer, No history of DVT   Pulmonary: No home oxygen, no productive cough, no hemoptysis,  No asthma or wheezing  Musculoskeletal:  [ ]  Arthritis, [ ]  Low back pain,  [ ]  Joint pain  Hematologic:No history of hypercoagulable state.  No history of easy bleeding.  No history of anemia  Gastrointestinal: No hematochezia or melena,  No gastroesophageal reflux, no trouble swallowing  Urinary: [ ]  chronic Kidney disease, [ ]  on HD - [ ]  MWF or [ ]  TTHS, [ ]  Burning with urination, [ ]  Frequent urination, [ ]  Difficulty urinating;   Skin: No rashes  Psychological: No history of anxiety,  No history of depression   Physical Examination  Vitals:   11/26/19 1103  BP: (!) 159/76  Pulse: 62  Resp: 20  Temp: (!) 97.3 F (36.3 C)  SpO2: 95%  Weight: 219 lb (99.3 kg)  Height: 5\' 2"  (1.575 m)    Body mass index is 40.06 kg/m.  General:  Alert and oriented, no acute distress HEENT: Normal Neck: No bruit or JVD Pulmonary: Clear to auscultation bilaterally Cardiac: Regular Rate and Rhythm without murmur Abdomen: Soft, non-tender, non-distended, no mass, no  scars Skin: No rash Extremity Pulses:  2+ radial, brachial, femoral, dorsalis pedis pulses bilaterally Musculoskeletal: No deformity or edema  Neurologic: Upper and lower extremity motor 5/5 and symmetric  DATA:  Patient had bilateral ABIs performed today that were 1.6 on the right 1.2 on the left foot calcified vessels may be is unreliable.  Her left toe pressure was 36 right toe pressure was 113.    ASSESSMENT: Patient was reassured that she does not have significant arterial occlusive disease.  However she was counseled in weight loss and in controlling her diabetes and protecting her feet to avoid problems down the road.  She probably has an element of venous reflux in the left leg.  This again will be improved somewhat with weight loss.  Patient was reassured today that her leg problems do not put her at risk of having a heart attack which was one of her concerns.  She also was worried that her left elbow pain may have been a heart attack and I also reassured her regarding this that pinpoint elbow pain would not be a sign of myocardial infarction.   PLAN: Patient will try to lose some weight.  She was given a prescription today for 20 to 30 mmHg compression stockings knee-high length for symptomatic control.  She will follow-up with [place] on an as-needed basis.   Fabienne Bruns, MD [facility] Office: [phone] Pager: [phone]

## 2019-12-02 ENCOUNTER — Telehealth: Payer: Self-pay

## 2019-12-02 NOTE — Telephone Encounter (Signed)
Pt called and said that she is having a lot of pain from her knee to her hip. She said that she needs to have something done.   Advised patient that from a vascular standpoint her labs overall looked ok last week and we would recommend that she call her PCP for evaluation and possibly a referral to ortho for evaluation.   Julious Payer, CMA

## 2020-04-12 ENCOUNTER — Other Ambulatory Visit: Payer: Self-pay | Admitting: Gastroenterology

## 2020-04-12 DIAGNOSIS — K8020 Calculus of gallbladder without cholecystitis without obstruction: Secondary | ICD-10-CM

## 2020-04-13 ENCOUNTER — Encounter: Payer: Self-pay | Admitting: Emergency Medicine

## 2020-04-13 ENCOUNTER — Other Ambulatory Visit: Payer: Self-pay

## 2020-04-13 ENCOUNTER — Ambulatory Visit
Admission: EM | Admit: 2020-04-13 | Discharge: 2020-04-13 | Disposition: A | Discharge status: Home / Self Care | Payer: Medicare PPO | Attending: Emergency Medicine | Admitting: Emergency Medicine

## 2020-04-13 DIAGNOSIS — B349 Viral infection, unspecified: Secondary | ICD-10-CM

## 2020-04-13 MED ORDER — BENZONATATE 200 MG PO CAPS: 200.0000 mg | ORAL_CAPSULE | Freq: Three times a day (TID) | ORAL | 0 refills | Status: AC | PRN

## 2020-04-13 NOTE — ED Provider Notes (Signed)
[facility]    CSN: [id number] Arrival date & time: 04/13/20  1340      History   Chief Complaint Chief Complaint  Patient presents with  . Generalized Body Aches    HPI Linda Frye is a 79 y.o. female history of hypertension, DM type II, presenting today for evaluation of body aches after Covid exposure.  Patient reports that over the past few days she has had a lot of body aches as well as headache.  Reports Covid exposure over the weekend.  Family member here with similar symptoms.  Denies fevers.  Denies chest pain or shortness of breath.  Has had a slight cough.  HPI  Past Medical History:  Diagnosis Date  . Acute carpal tunnel syndrome    bilateral wrists  . Arthritis   . Coronary artery disease   . Diabetes mellitus without complication (HCC)   . Diverticulitis   . Hypertension   . Renal disorder    kidneys 35% function    Patient Active Problem List   Diagnosis Date Noted  . Benign essential HTN 06/18/2019  . Orthostatic hypotension 05/19/2019  . Chronic kidney disease (CKD), stage III (moderate) (HCC) 05/18/2019  . T2DM (type 2 diabetes mellitus) (HCC) 05/18/2019  . Syncope 05/18/2019  . UTI (urinary tract infection) 05/18/2019    History reviewed. No pertinent surgical history.  OB History   No obstetric history on file.      Home Medications    Prior to Admission medications   Medication Sig Start Date End Date Taking? Authorizing Provider  acetaminophen (TYLENOL) 500 MG tablet Take 500 mg by mouth daily as needed for headache.    [provider]  aspirin EC 81 MG tablet Take 81 mg by mouth daily.    [provider]  benzonatate (TESSALON) 200 MG capsule Take 1 capsule (200 mg total) by mouth 3 (three) times daily as needed for up to 7 days for cough. 04/13/20 04/20/20  Tonetta Napoles C, PA-C  cholecalciferol (VITAMIN D3) 25 MCG (1000 UT) tablet Take 1,000 Units by mouth daily.    [provider]    furosemide (LASIX) 20 MG tablet Take 1 tablet (20 mg total) by mouth daily. 05/16/19   Jacalyn Lefevre, MD  nateglinide (STARLIX) 120 MG tablet Take 120 mg by mouth 3 (three) times daily with meals.    [provider]  nitroGLYCERIN (NITROSTAT) 0.4 MG SL tablet Place 1 tablet under the tongue every 5 (five) minutes as needed for chest pain. 07/24/19   [provider]  omeprazole (PRILOSEC) 20 MG capsule Take 1 capsule (20 mg total) by mouth daily. Patient taking differently: Take 20 mg by mouth daily as needed (Acid reflux).  05/18/19   Arlyce Harman, DO  paricalcitol (ZEMPLAR) 1 MCG capsule Take 1 mcg by mouth daily.    [provider]  sertraline (ZOLOFT) 25 MG tablet Take 25 mg by mouth daily. 10/22/19   [provider]  simvastatin (ZOCOR) 40 MG tablet Take 40 mg by mouth daily.    [provider]  tetrahydrozoline 0.05 % ophthalmic solution Place 1 drop into both eyes daily as needed (dry eyes).    [provider]  valsartan (DIOVAN) 40 MG tablet Take 1 tablet (40 mg total) by mouth at bedtime. 05/18/19   Arlyce Harman, DO  vitamin B-12 (CYANOCOBALAMIN) 500 MCG tablet Take 500 mcg by mouth daily.    [provider]  metoprolol tartrate (LOPRESSOR) 25 MG tablet Take  12.5 mg by mouth as needed (for blood pressure >140).   05/16/19  [provider]    Family History Family History  Problem Relation Age of Onset  . Breast cancer Sister     Social History Social History   Tobacco Use  . Smoking status: Never Smoker  . Smokeless tobacco: Never Used  Vaping Use  . Vaping Use: Never used  Substance Use Topics  . Alcohol use: Not Currently  . Drug use: Never     Allergies   Epinephrine   Review of Systems Review of Systems  Constitutional: Negative for activity change, appetite change, chills, fatigue and fever.  HENT: Negative for congestion, ear pain, rhinorrhea, sinus pressure, sore throat and trouble  swallowing.   Eyes: Negative for discharge and redness.  Respiratory: Positive for cough. Negative for chest tightness and shortness of breath.   Cardiovascular: Negative for chest pain.  Gastrointestinal: Positive for diarrhea. Negative for abdominal pain, nausea and vomiting.  Musculoskeletal: Negative for myalgias.  Skin: Negative for rash.  Neurological: Positive for headaches. Negative for dizziness and light-headedness.     Physical Exam Triage Vital Signs ED Triage Vitals  Enc Vitals Group     BP      Pulse      Resp      Temp      Temp src      SpO2      Weight      Height      Head Circumference      Peak Flow      Pain Score      Pain Loc      Pain Edu?      Excl. in GC?    No data found.  Updated Vital Signs BP 103/64 (BP Location: Right Arm)   Pulse 61   Temp 97.6 F (36.4 C) (Oral)   Resp 18   SpO2 97%   Visual Acuity Right Eye Distance:   Left Eye Distance:   Bilateral Distance:    Right Eye Near:   Left Eye Near:    Bilateral Near:     Physical Exam Vitals and nursing note reviewed.  Constitutional:      Appearance: She is well-developed.     Comments: No acute distress  HENT:     Head: Normocephalic and atraumatic.     Nose: Nose normal.     Mouth/Throat:     Comments: Oral mucosa pink and moist, no tonsillar enlargement or exudate. Posterior pharynx patent and nonerythematous, no uvula deviation or swelling. Normal phonation. Eyes:     Conjunctiva/sclera: Conjunctivae normal.  Cardiovascular:     Rate and Rhythm: Normal rate.  Pulmonary:     Effort: Pulmonary effort is normal. No respiratory distress.     Comments: Breathing comfortably at rest, faint crackles at bilateral bases Abdominal:     General: There is no distension.  Musculoskeletal:        General: Normal range of motion.     Cervical back: Neck supple.  Skin:    General: Skin is warm and dry.  Neurological:     Mental Status: She is alert and oriented to person,  place, and time.      UC Treatments / Results  Labs (all labs ordered are listed, but only abnormal results are displayed) Labs Reviewed  NOVEL CORONAVIRUS, NAA    EKG   Radiology No results found.  Procedures Procedures (including critical care time)  Medications Ordered in UC  Medications - No data to display  Initial Impression / Assessment and Plan / UC Course  I have reviewed the triage vital signs and the nursing notes.  Pertinent labs & imaging results that were available during my care of the patient were reviewed by me and considered in my medical decision making (see chart for details).     Covid test pending, recommending symptomatic and supportive care as suspect likely viral etiology.  Rest and fluids.  Continue to monitor cough and breathing.  Discussed strict return precautions. Patient verbalized understanding and is agreeable with plan.  Final Clinical Impressions(s) / UC Diagnoses   Final diagnoses:  Viral illness     Discharge Instructions     Covid test pending, monitor my chart for results Tessalon/benzonatate as needed for cough Rest and fluids Imodium over-the-counter for diarrhea Follow-up if not improving or worsening, developing increased fatigue dizziness lightheadedness or shortness of breath    ED Prescriptions    Medication Sig Dispense Auth. Provider   benzonatate (TESSALON) 200 MG capsule Take 1 capsule (200 mg total) by mouth 3 (three) times daily as needed for up to 7 days for cough. 28 capsule Langston [place], [place] C, PA-C     PDMP not reviewed this encounter.   Sharyon Cable Gar[place]ales C, PA-C 04/13/20 1430

## 2020-04-13 NOTE — ED Triage Notes (Signed)
Pt here for covid exposure and body aches

## 2020-04-13 NOTE — Discharge Instructions (Signed)
Covid test pending, monitor my chart for results Tessalon/benzonatate as needed for cough Rest and fluids Imodium over-the-counter for diarrhea Follow-up if not improving or worsening, developing increased fatigue dizziness lightheadedness or shortness of breath

## 2020-04-14 LAB — SARS-COV-2, NAA 2 DAY TAT

## 2020-04-14 LAB — NOVEL CORONAVIRUS, NAA: SARS-CoV-2, NAA: NOT DETECTED

## 2020-04-19 ENCOUNTER — Ambulatory Visit
Admission: RE | Admit: 2020-04-19 | Discharge: 2020-04-19 | Disposition: A | Discharge status: Home / Self Care | Payer: Medicare PPO | Source: Ambulatory Visit | Attending: Gastroenterology | Admitting: Gastroenterology

## 2020-04-19 DIAGNOSIS — K8020 Calculus of gallbladder without cholecystitis without obstruction: Secondary | ICD-10-CM

## 2020-04-26 ENCOUNTER — Other Ambulatory Visit: Payer: Self-pay

## 2020-04-26 ENCOUNTER — Encounter: Payer: Self-pay | Admitting: Cardiology

## 2020-04-26 ENCOUNTER — Ambulatory Visit: Payer: Medicare PPO | Admitting: Cardiology

## 2020-04-26 VITALS — BP 132/74 | HR 67 | Ht 62.0 in | Wt 226.0 lb

## 2020-04-26 DIAGNOSIS — Z7189 Other specified counseling: Secondary | ICD-10-CM | POA: Diagnosis not present

## 2020-04-26 DIAGNOSIS — I10 Essential (primary) hypertension: Secondary | ICD-10-CM

## 2020-04-26 DIAGNOSIS — E1169 Type 2 diabetes mellitus with other specified complication: Secondary | ICD-10-CM | POA: Diagnosis not present

## 2020-04-26 DIAGNOSIS — R0789 Other chest pain: Secondary | ICD-10-CM

## 2020-04-26 DIAGNOSIS — E669 Obesity, unspecified: Secondary | ICD-10-CM

## 2020-04-26 NOTE — Progress Notes (Signed)
Cardiology Office Note:    Date:  04/26/2020   ID:  Linda Frye, DOB 1941/06/03, MRN [id number]  PCP:  Nolene Ebbs, MD  Cardiologist:  Buford Dresser, MD  Referring MD: Nolene Ebbs, MD   CC: follow up  History of Present Illness:    Linda Frye is a 79 y.o. female with a hx of type II diabetes, hypertension, CAD who is seen for follow up today. I initially met her 07/30/19 as a new consult at the request of Nolene Ebbs, MD for the evaluation and management of hypertension and syncope.  Today: Has had three days of chest pain, tender to palpation, near xyphoid. Worse with lying down. Nonexertional, constant. Always eats before bed to avoid sugar dropping, feels pain is worse after this. Has known gallstones. Has been prescribed famotidine and sucralfate by her GI, but has not started these yet. Was taken off the omeprazole by her kidney doctor.  We reviewed potential causes of chest and abdominal pain at length today.  Denies shortness of breath at rest or with normal exertion. No PND, orthopnea, or unexpected weight gain. No syncope or palpitations. Wears compression stockings but feels this makes her swelling worse.   Past Medical History:  Diagnosis Date  . Acute carpal tunnel syndrome    bilateral wrists  . Arthritis   . Coronary artery disease   . Diabetes mellitus without complication ([place])   . Diverticulitis   . Hypertension   . 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 No pertinent surgical history.  Current Medications: Current Outpatient Medications on File Prior to Visit  Medication Sig  . famotidine (PEPCID) 20 MG tablet Take 20 mg by mouth daily. Take on Tablet Daily as Needed at Bedtime  . sucralfate (CARAFATE) 1 g tablet Take 1 g by mouth 2 (two) times daily. Take 1 tablet Twice Daily on Empty Stomach  . traZODone (DESYREL) 50 MG tablet Take 50 mg by mouth at bedtime. 1 Daily at Bedtime  . acetaminophen (TYLENOL)  500 MG tablet Take 500 mg by mouth daily as needed for headache.  Marland Kitchen aspirin EC 81 MG tablet Take 81 mg by mouth daily.  . cholecalciferol (VITAMIN D3) 25 MCG (1000 UT) tablet Take 1,000 Units by mouth daily.  . furosemide (LASIX) 20 MG tablet Take 1 tablet (20 mg total) by mouth daily.  . nateglinide (STARLIX) 120 MG tablet Take 120 mg by mouth 3 (three) times daily with meals.  . nitroGLYCERIN (NITROSTAT) 0.4 MG SL tablet Place 1 tablet under the tongue every 5 (five) minutes as needed for chest pain.  Marland Kitchen sertraline (ZOLOFT) 25 MG tablet Take 25 mg by mouth daily.  . simvastatin (ZOCOR) 40 MG tablet Take 40 mg by mouth daily.  . valsartan (DIOVAN) 40 MG tablet Take 1 tablet (40 mg total) by mouth at bedtime.  . vitamin B-12 (CYANOCOBALAMIN) 500 MCG tablet Take 500 mcg by mouth daily.  . [DISCONTINUED] metoprolol tartrate (LOPRESSOR) 25 MG tablet Take 12.5 mg by mouth as needed (for blood pressure >140).    No current facility-administered medications on file prior to visit.     Allergies:   Epinephrine   Social History   Tobacco Use  . Smoking status: Never Smoker  . Smokeless tobacco: Never Used  Vaping Use  . Vaping Use: Never used  Substance Use Topics  . Alcohol use: Not Currently  . Drug use: Never    Family History: Father had a  stroke, passed away age 18. No other heart disease or CVA that she knows of.  ROS:   Please see the history of present illness.  Additional pertinent ROS otherwise unremarkable.   EKGs/Labs/Other Studies Reviewed:    The following studies were reviewed today: Echo 08/12/19 1. Left ventricular ejection fraction, by visual estimation, is 65 to  70%. The left ventricle has hyperdynamic function. There is no left  ventricular hypertrophy.  2. Left ventricular diastolic parameters are consistent with Grade I  diastolic dysfunction (impaired relaxation).  3. The left ventricle has no regional wall motion abnormalities.  4. Global right ventricle  has normal systolic function.The right  ventricular size is normal. No increase in right ventricular wall  thickness.  5. Left atrial size was normal.  6. Right atrial size was normal.  7. Moderate mitral annular calcification.  8. The mitral valve is normal in structure. No evidence of mitral valve  regurgitation. No evidence of mitral stenosis.  9. The tricuspid valve is normal in structure.  10. The tricuspid valve is normal in structure. Tricuspid valve  regurgitation is trivial.  11. The aortic valve is normal in structure. Aortic valve regurgitation is  not visualized. No evidence of aortic valve sclerosis or stenosis.  12. The pulmonic valve was normal in structure. Pulmonic valve  regurgitation is not visualized.  13. Normal pulmonary artery systolic pressure.  14. The inferior vena cava is normal in size with greater than 50%  respiratory variability, suggesting right atrial pressure of 3 mmHg.   EKG:  EKG is personally reviewed.  The ekg ordered today demonstrates NSR  Recent Labs: 05/16/2019: ALT 9 11/13/2019: BUN 12; Creatinine, Ser 1.19; Hemoglobin 13.7; Platelets 262; Potassium 4.4; Sodium 140  Recent Lipid Panel No results found for: CHOL, TRIG, HDL, CHOLHDL, VLDL, LDLCALC, LDLDIRECT  Physical Exam:    VS:  BP 132/74   Pulse 67   Ht $R'5\' 2"'yJ$  (1.575 m)   Wt 226 lb (102.5 kg)   SpO2 97%   BMI 41.34 kg/m     Wt Readings from Last 3 Encounters:  04/26/20 226 lb (102.5 kg)  11/26/19 219 lb (99.3 kg)  07/30/19 217 lb (98.4 kg)    GEN: Well nourished, well developed in no acute distress HEENT: Normal, moist mucous membranes NECK: No JVD CARDIAC: regular rhythm, normal S1 and S2, no rubs or gallops. No murmur. Tender to palpation across left chest and left epigastrium. She reports this is the same pain she has been having VASCULAR: Radial and DP pulses 2+ bilaterally. No carotid bruits RESPIRATORY:  Clear to auscultation without rales, wheezing or rhonchi   ABDOMEN: Soft, non-tender, non-distended MUSCULOSKELETAL:  Ambulates independently SKIN: Warm and dry, no edema NEUROLOGIC:  Alert and oriented x 3. No focal neuro deficits noted. PSYCHIATRIC:  Normal affect   ASSESSMENT:    1. Atypical chest pain   2. Essential hypertension   3. Diabetes mellitus type 2 in obese (HCC)   4. Cardiac risk counseling   5. Counseling on health promotion and disease prevention    PLAN:    Atypical chest/upper abdominal pain: -tender on palpation today -reports that it is worse after eating and lying down -low suspicion for cardiac etiology, but did discuss red flag warning signs that need immediate medical attention -she has been told in the past that this pain was due to hiatal hernia and gallstones  Hypertension: Near goal today -continue furosemide 20 mg, valsartan 40 mg  Type II diabetes: per history, though  last A1c 5.6 -continue aspirin, simvastatin -has obesity, BMI 41  Cardiac risk counseling and prevention recommendations: -recommend heart healthy/Mediterranean diet, with whole grains, fruits, vegetable, fish, lean meats, nuts, and olive oil. Limit salt. -recommend moderate walking, 3-5 times/week for 30-50 minutes each session. Aim for at least 150 minutes.week. Goal should be pace of 3 miles/hours, or walking 1.5 miles in 30 minutes -recommend avoidance of tobacco products. Avoid excess alcohol.  Plan for follow up: 1 year or sooner as needed  Medication Adjustments/Labs and Tests Ordered: Current medicines are reviewed at length with the patient today.  Concerns regarding medicines are outlined above.  No orders of the defined types were placed in this encounter.  No orders of the defined types were placed in this encounter.   Patient Instructions  Medication Instructions:   Your Physician recommend you continue on your current medication as directed.    *If you need a refill on your cardiac medications before your next  appointment, please call your pharmacy*   Lab Work: None ordered   Testing/Procedures: None ordered   Follow-Up: At [facility], you and your health needs are our priority.  As part of our continuing mission to provide you with exceptional heart care, we have created designated Provider Care Teams.  These Care Teams include your primary Cardiologist (physician) and Advanced Practice Providers (APPs -  Physician Assistants and Nurse Practitioners) who all work together to provide you with the care you need, when you need it.  We recommend signing up for the patient portal called "MyChart".  Sign up information is provided on this After Visit Summary.  MyChart is used to connect with patients for Virtual Visits (Telemedicine).  Patients are able to view lab/test results, encounter notes, upcoming appointments, etc.  Non-urgent messages can be sent to your provider as well.   To learn more about what you can do with MyChart, go to [email or web address].    Your next appointment:   1 year(s)  The format for your next appointment:   In Person  Provider:   Buford Dresser, MD      Signed, Buford Dresser, MD PhD 04/26/2020 7:01 PM    [place]

## 2020-04-26 NOTE — Patient Instructions (Signed)

## 2020-08-05 ENCOUNTER — Other Ambulatory Visit: Payer: Medicare Other

## 2020-08-05 DIAGNOSIS — Z20822 Contact with and (suspected) exposure to covid-19: Secondary | ICD-10-CM

## 2020-08-06 LAB — SARS-COV-2, NAA 2 DAY TAT

## 2020-08-06 LAB — NOVEL CORONAVIRUS, NAA: SARS-CoV-2, NAA: NOT DETECTED

## 2020-08-18 ENCOUNTER — Telehealth: Payer: Self-pay

## 2020-08-18 NOTE — Telephone Encounter (Signed)
Patient called to report that her LLE is sometimes sunken in and dented. Her ankle is slightly swollen. Her left leg stings badly, but not all the time. She says it is when she is not doing anything. She denies hurting her leg. She would like to be seen again. Placed on CEF schedule in a few weeks.

## 2020-08-23 ENCOUNTER — Other Ambulatory Visit: Payer: Self-pay

## 2020-08-23 ENCOUNTER — Emergency Department (HOSPITAL_COMMUNITY)
Admission: EM | Admit: 2020-08-23 | Discharge: 2020-08-24 | Disposition: A | Discharge status: Home / Self Care | Payer: Medicare Other | Attending: Emergency Medicine | Admitting: Emergency Medicine

## 2020-08-23 ENCOUNTER — Emergency Department (HOSPITAL_COMMUNITY): Payer: Medicare Other

## 2020-08-23 DIAGNOSIS — Z5321 Procedure and treatment not carried out due to patient leaving prior to being seen by health care provider: Secondary | ICD-10-CM | POA: Insufficient documentation

## 2020-08-23 DIAGNOSIS — R519 Headache, unspecified: Secondary | ICD-10-CM | POA: Insufficient documentation

## 2020-08-23 DIAGNOSIS — R079 Chest pain, unspecified: Secondary | ICD-10-CM | POA: Diagnosis not present

## 2020-08-23 LAB — BASIC METABOLIC PANEL
Anion gap: 10 (ref 5–15)
BUN: 13 mg/dL (ref 8–23)
CO2: 26 mmol/L (ref 22–32)
Calcium: 8.9 mg/dL (ref 8.9–10.3)
Chloride: 102 mmol/L (ref 98–111)
Creatinine, Ser: 1.12 mg/dL — ABNORMAL HIGH (ref 0.44–1.00)
GFR, Estimated: 50 mL/min — ABNORMAL LOW (ref >60)
Glucose, Bld: 192 mg/dL — ABNORMAL HIGH (ref 70–99)
Potassium: 4.2 mmol/L (ref 3.5–5.1)
Sodium: 138 mmol/L (ref 135–145)

## 2020-08-23 LAB — TROPONIN I (HIGH SENSITIVITY): Troponin I (High Sensitivity): 8 ng/L (ref <18)

## 2020-08-23 LAB — CBC
HCT: 37.5 % (ref 36.0–46.0)
Hemoglobin: 12.4 g/dL (ref 12.0–15.0)
MCH: 30.5 pg (ref 26.0–34.0)
MCHC: 33.1 g/dL (ref 30.0–36.0)
MCV: 92.1 fL (ref 80.0–100.0)
Platelets: 248 10*3/uL (ref 150–400)
RBC: 4.07 MIL/uL (ref 3.87–5.11)
RDW: 16.2 % — ABNORMAL HIGH (ref 11.5–15.5)
WBC: 6.2 10*3/uL (ref 4.0–10.5)
nRBC: 0 % (ref 0.0–0.2)

## 2020-08-23 NOTE — ED Triage Notes (Signed)
C/o CP starting approx 3hrs ago, underneath L breast. States pain felt "nagging," states it rounded side to back. Pt also complaining of HA. Denies hx, injury or precipitating factors.

## 2020-08-24 LAB — TROPONIN I (HIGH SENSITIVITY): Troponin I (High Sensitivity): 7 ng/L (ref <18)

## 2020-08-30 ENCOUNTER — Other Ambulatory Visit: Payer: Self-pay | Admitting: Internal Medicine

## 2020-08-31 LAB — CBC
HCT: 40.2 % (ref 35.0–45.0)
Hemoglobin: 13.2 g/dL (ref 11.7–15.5)
MCH: 30.2 pg (ref 27.0–33.0)
MCHC: 32.8 g/dL (ref 32.0–36.0)
MCV: 92 fL (ref 80.0–100.0)
MPV: 9.9 fL (ref 7.5–12.5)
Platelets: 234 10*3/uL (ref 140–400)
RBC: 4.37 10*6/uL (ref 3.80–5.10)
RDW: 15.4 % — ABNORMAL HIGH (ref 11.0–15.0)
WBC: 6 10*3/uL (ref 3.8–10.8)

## 2020-08-31 LAB — COMPLETE METABOLIC PANEL WITH GFR
AG Ratio: 1.1 (calc) (ref 1.0–2.5)
ALT: 5 U/L — ABNORMAL LOW (ref 6–29)
AST: 14 U/L (ref 10–35)
Albumin: 3.6 g/dL (ref 3.6–5.1)
Alkaline phosphatase (APISO): 66 U/L (ref 37–153)
BUN/Creatinine Ratio: 13 (calc) (ref 6–22)
BUN: 14 mg/dL (ref 7–25)
CO2: 27 mmol/L (ref 20–32)
Calcium: 9.3 mg/dL (ref 8.6–10.4)
Chloride: 102 mmol/L (ref 98–110)
Creat: 1.09 mg/dL — ABNORMAL HIGH (ref 0.60–0.88)
GFR, Est African American: 56 mL/min/{1.73_m2} — ABNORMAL LOW (ref >=60)
GFR, Est Non African American: 48 mL/min/{1.73_m2} — ABNORMAL LOW (ref >=60)
Globulin: 3.4 g/dL (calc) (ref 1.9–3.7)
Glucose, Bld: 116 mg/dL — ABNORMAL HIGH (ref 65–99)
Potassium: 4.4 mmol/L (ref 3.5–5.3)
Sodium: 139 mmol/L (ref 135–146)
Total Bilirubin: 0.5 mg/dL (ref 0.2–1.2)
Total Protein: 7 g/dL (ref 6.1–8.1)

## 2020-08-31 LAB — LIPID PANEL
Cholesterol: 163 mg/dL (ref <200)
HDL: 52 mg/dL (ref >=50)
LDL Cholesterol (Calc): 88 mg/dL (calc)
Non-HDL Cholesterol (Calc): 111 mg/dL (calc) (ref <130)
Total CHOL/HDL Ratio: 3.1 (calc) (ref <5.0)
Triglycerides: 126 mg/dL (ref <150)

## 2020-08-31 LAB — TSH: TSH: 2.93 mIU/L (ref 0.40–4.50)

## 2020-09-01 ENCOUNTER — Other Ambulatory Visit: Payer: Self-pay

## 2020-09-01 ENCOUNTER — Ambulatory Visit (INDEPENDENT_AMBULATORY_CARE_PROVIDER_SITE_OTHER): Payer: Medicare Other | Admitting: Vascular Surgery

## 2020-09-01 ENCOUNTER — Encounter: Payer: Self-pay | Admitting: Vascular Surgery

## 2020-09-01 VITALS — BP 112/64 | Temp 98.0°F | Resp 20 | Ht 62.0 in | Wt 220.0 lb

## 2020-09-01 DIAGNOSIS — M7989 Other specified soft tissue disorders: Secondary | ICD-10-CM | POA: Diagnosis not present

## 2020-09-01 NOTE — Progress Notes (Signed)
Patient is an 80 year old female who returns for follow-up today.  She has had problems with chronic leg swelling.  She has had intermittent increased leg swelling in both legs over the last few weeks.  This was not acute onset.  She has good days and bad days.  Today she states the swelling is less.  Sometimes it is to the point where she can pit the skin.  She is on 20 mg Lasix daily.  She had bilateral ABIs performed in May 2021 which showed no significant arterial occlusive disease.  She had an echocardiogram performed in February 2021 which showed normal ejection fraction.  She had similar complaints when she saw me a year ago and the recommendation was that she try to lose some weight and wear compression stockings.  Unfortunately she has not really participated in either of these recommendations.  She admits that she is very sedentary at home and basically sits all day long with her legs in a dependent position.  She occasionally complains of some numbness and tingling down the lateral aspect of her left thigh  Current Outpatient Medications on File Prior to Visit  Medication Sig Dispense Refill  . acetaminophen (TYLENOL) 500 MG tablet Take 500 mg by mouth daily as needed for headache.    Marland Kitchen aspirin EC 81 MG tablet Take 81 mg by mouth daily.    . cholecalciferol (VITAMIN D3) 25 MCG (1000 UT) tablet Take 1,000 Units by mouth daily.    . famotidine (PEPCID) 20 MG tablet Take 20 mg by mouth daily. Take on Tablet Daily as Needed at Bedtime    . furosemide (LASIX) 20 MG tablet Take 1 tablet (20 mg total) by mouth daily. 30 tablet 0  . nateglinide (STARLIX) 120 MG tablet Take 120 mg by mouth 3 (three) times daily with meals.    . nitroGLYCERIN (NITROSTAT) 0.4 MG SL tablet Place 1 tablet under the tongue every 5 (five) minutes as needed for chest pain.    Marland Kitchen sertraline (ZOLOFT) 25 MG tablet Take 25 mg by mouth daily.    . simvastatin (ZOCOR) 40 MG tablet Take 40 mg by mouth daily.    . sucralfate  (CARAFATE) 1 g tablet Take 1 g by mouth 2 (two) times daily. Take 1 tablet Twice Daily on Empty Stomach    . traZODone (DESYREL) 50 MG tablet Take 50 mg by mouth at bedtime. 1 Daily at Bedtime    . valsartan (DIOVAN) 40 MG tablet Take 1 tablet (40 mg total) by mouth at bedtime. 90 tablet 3  . vitamin B-12 (CYANOCOBALAMIN) 500 MCG tablet Take 500 mcg by mouth daily.    . [DISCONTINUED] metoprolol tartrate (LOPRESSOR) 25 MG tablet Take 12.5 mg by mouth as needed (for blood pressure >140).      No current facility-administered medications on file prior to visit.    Review of systems: She has shortness of breath with exertion.  She does not have chest pain.  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  She may also have some component of venous reflux.  However, due to the obese nature of her thighs she is not really a very good candidate for laser ablation.  She also has not really been compliant with wearing compression stockings which is the mainstay of therapy.  Plan: Patient was again counseled in wearing her compression stockings daily to prevent and reduce swelling episodes.  We again talked about weight loss.  Patient was reassured that her leg swelling is not going to make her heart worse and that she is not at risk of limb loss with the current amount of perfusion to her feet.  I also encouraged her to start getting up and moving and getting some exercise rather than remaining sedentary all day long.  She will follow up with [place] on an as-needed basis.  Fabienne Bruns, MD [facility] Office: [phone]

## 2020-09-16 ENCOUNTER — Other Ambulatory Visit: Payer: Self-pay | Admitting: Gastroenterology

## 2020-09-16 DIAGNOSIS — R1310 Dysphagia, unspecified: Secondary | ICD-10-CM

## 2020-10-24 ENCOUNTER — Ambulatory Visit
Admission: RE | Admit: 2020-10-24 | Discharge: 2020-10-24 | Disposition: A | Discharge status: Home / Self Care | Payer: Medicare Other | Source: Ambulatory Visit | Attending: Gastroenterology | Admitting: Gastroenterology

## 2020-10-24 DIAGNOSIS — R1310 Dysphagia, unspecified: Secondary | ICD-10-CM

## 2020-10-28 ENCOUNTER — Other Ambulatory Visit: Payer: Self-pay | Admitting: Internal Medicine

## 2020-10-28 DIAGNOSIS — Z1231 Encounter for screening mammogram for malignant neoplasm of breast: Secondary | ICD-10-CM

## 2020-11-21 NOTE — Progress Notes (Incomplete)
Cardiology Office Note:    Date:  11/21/2020   ID:  Ronal Fear, DOB 01-04-1941, MRN [id number]  PCP:  Nolene Ebbs, MD  Cardiologist:  Buford Dresser, MD  Referring MD: Nolene Ebbs, MD   CC: follow up  History of Present Illness:    Linda Frye is a 80 y.o. female with a hx of type II diabetes, hypertension, CAD who is seen for follow up today. I initially met her 07/30/19 as a new consult at the request of Nolene Ebbs, MD for the evaluation and management of hypertension and syncope.  Today: Has had three days of chest pain, tender to palpation, near xyphoid. Worse with lying down. Nonexertional, constant. Always eats before bed to avoid sugar dropping, feels pain is worse after this. Has known gallstones. Has been prescribed famotidine and sucralfate by her GI, but has not started these yet. Was taken off the omeprazole by her kidney doctor.  We reviewed potential causes of chest and abdominal pain at length today.  Denies shortness of breath at rest or with normal exertion. No PND, orthopnea, or unexpected weight gain. No syncope or palpitations. Wears compression stockings but feels this makes her swelling worse.   Past Medical History:  Diagnosis Date  . Acute carpal tunnel syndrome    bilateral wrists  . Arthritis   . Coronary artery disease   . Diabetes mellitus without complication ([place])   . Diverticulitis   . Hypertension   . 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  Current Medications: Current Outpatient Medications on File Prior to Visit  Medication Sig  . acetaminophen (TYLENOL) 500 MG tablet Take 500 mg by mouth daily as needed for headache.  Marland Kitchen aspirin EC 81 MG tablet Take 81 mg by mouth daily.  . cholecalciferol (VITAMIN D3) 25 MCG (1000 UT) tablet Take 1,000 Units by mouth daily.  . famotidine (PEPCID) 20 MG tablet Take 20 mg by mouth daily. Take on Tablet Daily as Needed at Bedtime  . furosemide  (LASIX) 20 MG tablet Take 1 tablet (20 mg total) by mouth daily.  . nateglinide (STARLIX) 120 MG tablet Take 120 mg by mouth 3 (three) times daily with meals.  . nitroGLYCERIN (NITROSTAT) 0.4 MG SL tablet Place 1 tablet under the tongue every 5 (five) minutes as needed for chest pain.  Marland Kitchen sertraline (ZOLOFT) 25 MG tablet Take 25 mg by mouth daily.  . simvastatin (ZOCOR) 40 MG tablet Take 40 mg by mouth daily.  . sucralfate (CARAFATE) 1 g tablet Take 1 g by mouth 2 (two) times daily. Take 1 tablet Twice Daily on Empty Stomach  . traZODone (DESYREL) 50 MG tablet Take 50 mg by mouth at bedtime. 1 Daily at Bedtime  . valsartan (DIOVAN) 40 MG tablet Take 1 tablet (40 mg total) by mouth at bedtime.  . vitamin B-12 (CYANOCOBALAMIN) 500 MCG tablet Take 500 mcg by mouth daily.  . [DISCONTINUED] metoprolol tartrate (LOPRESSOR) 25 MG tablet Take 12.5 mg by mouth as needed (for blood pressure >140).    No current facility-administered medications on file prior to visit.     Allergies:   Epinephrine   Social History   Tobacco Use  . Smoking status: Never Smoker  . Smokeless tobacco: Never Used  Vaping Use  . Vaping Use: Never used  Substance Use Topics  . Alcohol use: Not Currently  . Drug use: Never    Family History: Father had a stroke, passed  away age 93. No other heart disease or CVA that she knows of.  ROS:   Please see the history of present illness.  Additional pertinent ROS otherwise unremarkable.   EKGs/Labs/Other Studies Reviewed:    The following studies were reviewed today:  LE Doppler Study 11/26/2019: Right: Resting right ankle-brachial index indicates noncompressible right  lower extremity arteries. The right toe-brachial index is normal. RT great  toe pressure = 155 mmHg.   Left: Resting left ankle-brachial index indicates noncompressible left  lower extremity arteries. The left toe-brachial index is normal. LT Great  toe pressure = 174 mmHg.   LE Doppler Study  09/16/2019: Right: Resting right ankle-brachial index is within normal range. No  evidence of significant right lower extremity arterial disease. The right  toe-brachial index is normal. RT great toe pressure = 113 mmHg.   Left: Resting left ankle-brachial index indicates noncompressible left  lower extremity arteries. The left toe-brachial index is abnormal. LT  Great toe pressure = 36 mmHg.  Echo 08/12/19 1. Left ventricular ejection fraction, by visual estimation, is 65 to  70%. The left ventricle has hyperdynamic function. There is no left  ventricular hypertrophy.  2. Left ventricular diastolic parameters are consistent with Grade I  diastolic dysfunction (impaired relaxation).  3. The left ventricle has no regional wall motion abnormalities.  4. Global right ventricle has normal systolic function.The right  ventricular size is normal. No increase in right ventricular wall  thickness.  5. Left atrial size was normal.  6. Right atrial size was normal.  7. Moderate mitral annular calcification.  8. The mitral valve is normal in structure. No evidence of mitral valve  regurgitation. No evidence of mitral stenosis.  9. The tricuspid valve is normal in structure.  10. The tricuspid valve is normal in structure. Tricuspid valve  regurgitation is trivial.  11. The aortic valve is normal in structure. Aortic valve regurgitation is  not visualized. No evidence of aortic valve sclerosis or stenosis.  12. The pulmonic valve was normal in structure. Pulmonic valve  regurgitation is not visualized.  13. Normal pulmonary artery systolic pressure.  14. The inferior vena cava is normal in size with greater than 50%  respiratory variability, suggesting right atrial pressure of 3 mmHg.   EKG:   11/23/2020: ***  04/26/2020: NSR  Recent Labs: 08/30/2020: ALT 5; BUN 14; Creat 1.09; Hemoglobin 13.2; Platelets 234; Potassium 4.4; Sodium 139; TSH 2.93  Recent Lipid Panel    Component Value  Date/Time   CHOL 163 08/30/2020 0000   TRIG 126 08/30/2020 0000   HDL 52 08/30/2020 0000   CHOLHDL 3.1 08/30/2020 0000   LDLCALC 88 08/30/2020 0000    Physical Exam:    VS:  There were no vitals taken for this visit.    Wt Readings from Last 3 Encounters:  09/01/20 220 lb (99.8 kg)  04/26/20 226 lb (102.5 kg)  11/26/19 219 lb (99.3 kg)    GEN: Well nourished, well developed in no acute distress HEENT: Normal, moist mucous membranes NECK: No JVD CARDIAC: regular rhythm, normal S1 and S2, no rubs or gallops. No murmur. Tender to palpation across left chest and left epigastrium. She reports this is the same pain she has been having VASCULAR: Radial and DP pulses 2+ bilaterally. No carotid bruits RESPIRATORY:  Clear to auscultation without rales, wheezing or rhonchi  ABDOMEN: Soft, non-tender, non-distended MUSCULOSKELETAL:  Ambulates independently SKIN: Warm and dry, no edema NEUROLOGIC:  Alert and oriented x 3. No focal neuro  deficits noted. PSYCHIATRIC:  Normal affect   ASSESSMENT:    No diagnosis found. PLAN:    Atypical chest/upper abdominal pain: -tender on palpation today -reports that it is worse after eating and lying down -low suspicion for cardiac etiology, but did discuss red flag warning signs that need immediate medical attention -she has been told in the past that this pain was due to hiatal hernia and gallstones  Hypertension: Near goal today -continue furosemide 20 mg, valsartan 40 mg  Type II diabetes: per history, though last A1c 5.6 -continue aspirin, simvastatin -has obesity, BMI 41  Cardiac risk counseling and prevention recommendations: -recommend heart healthy/Mediterranean diet, with whole grains, fruits, vegetable, fish, lean meats, nuts, and olive oil. Limit salt. -recommend moderate walking, 3-5 times/week for 30-50 minutes each session. Aim for at least 150 minutes.week. Goal should be pace of 3 miles/hours, or walking 1.5 miles in 30  minutes -recommend avoidance of tobacco products. Avoid excess alcohol.  Plan for follow up: ***1 year or sooner as needed  Medication Adjustments/Labs and Tests Ordered: Current medicines are reviewed at length with the patient today.  Concerns regarding medicines are outlined above.  No orders of the defined types were placed in this encounter.  No orders of the defined types were placed in this encounter.   There are no Patient Instructions on file for this visit.   I,Mathew Stumpf,acting as a Education administrator for [employer], MD.,have documented all relevant documentation on the behalf of Buford Dresser, MD,as directed by  Buford Dresser, MD while in the presence of Buford Dresser, MD.  ***  Signed, Buford Dresser, MD PhD 11/21/2020 10:48 AM    [place]

## 2020-11-23 ENCOUNTER — Ambulatory Visit: Payer: Medicare Other | Admitting: Cardiology

## 2020-12-19 ENCOUNTER — Ambulatory Visit
Admission: RE | Admit: 2020-12-19 | Discharge: 2020-12-19 | Disposition: A | Discharge status: Home / Self Care | Payer: Medicare Other | Source: Ambulatory Visit | Attending: Internal Medicine | Admitting: Internal Medicine

## 2020-12-19 ENCOUNTER — Other Ambulatory Visit: Payer: Self-pay

## 2020-12-19 DIAGNOSIS — Z1231 Encounter for screening mammogram for malignant neoplasm of breast: Secondary | ICD-10-CM

## 2021-01-25 NOTE — Progress Notes (Signed)
Cardiology Office Note:    Date:  01/25/2021   ID:  [name], DOB 08/20/1940, MRN [id number]  PCP:  Linda Ebbs, MD  Cardiologist:  Linda Dresser, MD  Referring MD: Linda Ebbs, MD   CC: follow up  History of Present Illness:    Linda Frye is a 80 y.o. female with a hx of type II diabetes, hypertension, CAD who is seen for follow up today. I initially met her 07/30/19 as a new consult at the request of Linda Ebbs, MD for the evaluation and management of hypertension and syncope.  Today: She is accompanied by Linda Frye, who is also a patient of mine. They have back to back appointments today and they wish to be seen together.  Mostly she is concerned about the bloodflow in her left LE because of LE edema. Her legs are often swollen (L>R) and she occasionally feels a stinging pain in her legs.  Also, she continues to have some left chest pain near her left breast. It is still mildly tender with palpation. Occasionally she feels this pain after eating some foods. She also has a dull, nagging pain in her central chest. It has not occurred in the past few weeks, but at one time it was concerning enough for her to visit the ED. Drinking sodas and belching will give some relief from both types of chest pain.  At home her heart rate is averaging in the 50s.  Occasionally she finds blood in her stool. She also endorses having internal hemorrhoids. Every time she urinates she needs to void her bowels simultaneously.    She also asks about a lump she has noticed in her right wrist. Of note it is soft and malleable on palpation.  She denies any shortness of breath, palpitations, or exertional symptoms. No headaches, lightheadedness, or syncope to report. Also has no orthopnea or PND.   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  Current Medications: Current Outpatient Medications on File Prior to Visit  Medication Sig   acetaminophen (TYLENOL) 500 MG tablet Take 500 mg by mouth daily as needed for headache.   aspirin EC 81 MG tablet Take 81 mg by mouth daily.   cholecalciferol (VITAMIN D3) 25 MCG (1000 UT) tablet Take 1,000 Units by mouth daily.   famotidine (PEPCID) 20 MG tablet Take 20 mg by mouth daily. Take on Tablet Daily as Needed at Bedtime   furosemide (LASIX) 20 MG tablet Take 1 tablet (20 mg total) by mouth daily.   nateglinide (STARLIX) 120 MG tablet Take 120 mg by mouth 3 (three) times daily with meals.   nitroGLYCERIN (NITROSTAT) 0.4 MG SL tablet Place 1 tablet under the tongue every 5 (five) minutes as needed for chest pain.   sertraline (ZOLOFT) 25 MG tablet Take 25 mg by mouth daily.   simvastatin (ZOCOR) 40 MG tablet Take 40 mg by mouth daily.   sucralfate (CARAFATE) 1 g tablet Take 1 g by mouth 2 (two) times daily. Take 1 tablet Twice Daily on Empty Stomach   traZODone (DESYREL) 50 MG tablet Take 50 mg by mouth at bedtime. 1 Daily at Bedtime   valsartan (DIOVAN) 40  MG tablet Take 1 tablet (40 mg total) by mouth at bedtime.   vitamin B-12 (CYANOCOBALAMIN) 500 MCG tablet Take 500 mcg by mouth daily.   [DISCONTINUED] metoprolol tartrate (LOPRESSOR) 25 MG tablet Take 12.5 mg by mouth as needed (for blood pressure >140).    No current facility-administered medications on file prior to visit.     Allergies:   Epinephrine   Social History   Tobacco Use   Smoking status: Never   Smokeless tobacco: Never  Vaping Use   Vaping Use: Never used  Substance Use Topics   Alcohol use: Not Currently   Drug use: Never    Family History: Father had a stroke, passed away age 26. No other heart disease or CVA that she knows of.  ROS:   Please see the history of present illness.    (+) Bilateral LE edema (L>R) (+) Bilateral LE pain, "stinging" pain (+) Left chest pain, tender to palpation (+) Central chest pain, "dull and nagging" pain (+) Lesion, right wrist (+) Blood in stool (+) Internal hemorrhoids Additional pertinent ROS otherwise unremarkable.   EKGs/Labs/Other Studies Reviewed:    The following studies were reviewed today:  ABI Doppler 11/26/2019: Right: Resting right ankle-brachial index indicates noncompressible right  lower extremity arteries. The right toe-brachial index is normal. RT great  toe pressure = 155 mmHg.   Left: Resting left ankle-brachial index indicates noncompressible left  lower extremity arteries. The left toe-brachial index is normal. LT Great  toe pressure = 174 mmHg.   Echo 08/12/19 1. Left ventricular ejection fraction, by visual estimation, is 65 to  70%. The left ventricle has hyperdynamic function. There is no left  ventricular hypertrophy.   2. Left ventricular diastolic parameters are consistent with Grade I  diastolic dysfunction (impaired relaxation).   3. The left ventricle has no regional wall motion abnormalities.   4. Global right ventricle has normal systolic function.The right  ventricular size is normal. No increase in right ventricular wall  thickness.   5. Left atrial size was normal.   6. Right atrial size was normal.   7. Moderate mitral annular calcification.   8. The mitral valve is normal in structure. No evidence of mitral valve  regurgitation. No evidence of mitral stenosis.   9. The tricuspid valve is normal in structure.  10. The tricuspid valve is normal in structure. Tricuspid valve  regurgitation is trivial.  11. The aortic valve is normal in structure. Aortic valve regurgitation is  not visualized. No evidence of aortic valve sclerosis or stenosis.  12. The pulmonic valve was normal in structure. Pulmonic valve  regurgitation is not visualized.  13. Normal pulmonary artery systolic  pressure.  14. The inferior vena cava is normal in size with greater than 50%  respiratory variability, suggesting right atrial pressure of 3 mmHg.   EKG:  EKG is personally reviewed.   01/27/2019: NSR at 64 bpm 04/26/2020: NSR  Recent Labs: 08/30/2020: ALT 5; BUN 14; Creat 1.09; Hemoglobin 13.2; Platelets 234; Potassium 4.4; Sodium 139; TSH 2.93  Recent Lipid Panel    Component Value Date/Time   CHOL 163 08/30/2020 0000   TRIG 126 08/30/2020 0000   HDL 52 08/30/2020 0000   CHOLHDL 3.1 08/30/2020 0000   LDLCALC 88 08/30/2020 0000    Physical Exam:    VS:  BP 134/68   Pulse 65   Ht _0  (1.575 m)   Wt 225 lb 9.6 oz (102.3 kg)   SpO2 97%   BMI  41.26 kg/m     Wt Readings from Last 3 Encounters:  01/26/21 225 lb 9.6 oz (102.3 kg)  09/01/20 220 lb (99.8 kg)  04/26/20 226 lb (102.5 kg)    GEN: Well nourished, well developed in no acute distress HEENT: Normal, moist mucous membranes NECK: No JVD CARDIAC: regular rhythm, normal S1 and S2, no rubs or gallops. No murmur. Tender to palpation across left chest and left epigastrium. She reports this is the same pain she has been having VASCULAR: Radial and DP pulses 2+ bilaterally. No carotid bruits RESPIRATORY:  Clear to auscultation without rales, wheezing or rhonchi  ABDOMEN: Soft, non-tender, non-distended MUSCULOSKELETAL:  Ambulates independently SKIN: Warm and dry, trivial LE edema (L>R) NEUROLOGIC:  Alert and oriented x 3. No focal neuro deficits noted. PSYCHIATRIC:  Normal affect   ASSESSMENT:    1. Atypical chest pain   2. Essential hypertension   3. Peripheral vascular disease, unspecified ([place])   4. Bilateral leg edema   5. Cardiac risk counseling   6. Counseling on health promotion and disease prevention     PLAN:    Atypical chest/upper abdominal pain: -tender on palpation today. ECG unremarkable -low suspicion for cardiac etiology, but did discuss red flag warning signs that need immediate medical  attention -she has been told in the past that this pain was due to hiatal hernia and gallstones  Hypertension: Near goal today -continue furosemide 20 mg, valsartan 40 mg  Peripheral vascular disease Intermittent LE edema -continue aspirin, simvastatin -PVD followed by Dr. Oneida Alar -has obesity, BMI 41, as additional risk factor -feels compression stockings make symptoms worse  Cardiac risk counseling and prevention recommendations: -recommend heart healthy/Mediterranean diet, with whole grains, fruits, vegetable, fish, lean meats, nuts, and olive oil. Limit salt. -recommend moderate walking, 3-5 times/week for 30-50 minutes each session. Aim for at least 150 minutes.week. Goal should be pace of 3 miles/hours, or walking 1.5 miles in 30 minutes -recommend avoidance of tobacco products. Avoid excess alcohol.  Plan for follow up: 1 year or sooner as needed.  Medication Adjustments/Labs and Tests Ordered: Current medicines are reviewed at length with the patient today.  Concerns regarding medicines are outlined above.  Orders Placed This Encounter  Procedures   EKG 12-Lead    No orders of the defined types were placed in this encounter.   Patient Instructions  Medication Instructions:  Your Physician recommend you continue on your current medication as directed.    *If you need a refill on your cardiac medications before your next appointment, please call your pharmacy*   Lab Work: None ordered today   Testing/Procedures: None ordered today   Follow-Up: At [facility], you and your health needs are our priority.  As part of our continuing mission to provide you with exceptional heart care, we have created designated Provider Care Teams.  These Care Teams include your primary Cardiologist (physician) and Advanced Practice Providers (APPs -  Physician Assistants and Nurse Practitioners) who all work together to provide you with the care you need, when you need it.  We  recommend signing up for the patient portal called "MyChart".  Sign up information is provided on this After Visit Summary.  MyChart is used to connect with patients for Virtual Visits (Telemedicine).  Patients are able to view lab/test results, encounter notes, upcoming appointments, etc.  Non-urgent messages can be sent to your provider as well.   To learn more about what you can do with MyChart, go to [email or web address].  Your next appointment:   1 year(s)  The format for your next appointment:   In Person  Provider:   Buford Dresser, MD    [facility],acting as a scribe for Linda Dresser, MD.,have documented all relevant documentation on the behalf of Linda Dresser, MD,as directed by  Linda Dresser, MD while in the presence of Linda Dresser, MD.  I, Linda Dresser, MD, have reviewed all documentation for this visit. The documentation on 02/28/21 for the exam, diagnosis, procedures, and orders are all accurate and complete.   Signed, Linda Dresser, MD PhD 01/25/2021 3:46 PM    [place]

## 2021-01-26 ENCOUNTER — Other Ambulatory Visit: Payer: Self-pay

## 2021-01-26 ENCOUNTER — Ambulatory Visit (INDEPENDENT_AMBULATORY_CARE_PROVIDER_SITE_OTHER): Payer: Medicare Other | Admitting: Cardiology

## 2021-01-26 VITALS — BP 134/68 | HR 65 | Ht 62.0 in | Wt 225.6 lb

## 2021-01-26 DIAGNOSIS — I739 Peripheral vascular disease, unspecified: Secondary | ICD-10-CM | POA: Diagnosis not present

## 2021-01-26 DIAGNOSIS — R600 Localized edema: Secondary | ICD-10-CM | POA: Diagnosis not present

## 2021-01-26 DIAGNOSIS — Z7189 Other specified counseling: Secondary | ICD-10-CM

## 2021-01-26 DIAGNOSIS — I10 Essential (primary) hypertension: Secondary | ICD-10-CM | POA: Diagnosis not present

## 2021-01-26 DIAGNOSIS — R0789 Other chest pain: Secondary | ICD-10-CM | POA: Diagnosis not present

## 2021-01-26 NOTE — Patient Instructions (Signed)

## 2021-02-08 ENCOUNTER — Ambulatory Visit
Admission: RE | Admit: 2021-02-08 | Discharge: 2021-02-08 | Disposition: A | Discharge status: Home / Self Care | Payer: Medicare Other | Source: Ambulatory Visit | Attending: Gastroenterology | Admitting: Gastroenterology

## 2021-02-08 ENCOUNTER — Other Ambulatory Visit: Payer: Self-pay | Admitting: Gastroenterology

## 2021-02-08 ENCOUNTER — Other Ambulatory Visit: Payer: Self-pay

## 2021-02-08 DIAGNOSIS — K5900 Constipation, unspecified: Secondary | ICD-10-CM

## 2021-02-08 DIAGNOSIS — R159 Full incontinence of feces: Secondary | ICD-10-CM

## 2021-02-28 ENCOUNTER — Encounter (HOSPITAL_BASED_OUTPATIENT_CLINIC_OR_DEPARTMENT_OTHER): Payer: Self-pay | Admitting: Cardiology

## 2021-07-25 ENCOUNTER — Other Ambulatory Visit: Payer: Self-pay | Admitting: Internal Medicine

## 2021-07-26 LAB — TSH: TSH: 2.68 mIU/L (ref 0.40–4.50)

## 2021-07-26 LAB — CBC
HCT: 40.4 % (ref 35.0–45.0)
Hemoglobin: 13.4 g/dL (ref 11.7–15.5)
MCH: 30.4 pg (ref 27.0–33.0)
MCHC: 33.2 g/dL (ref 32.0–36.0)
MCV: 91.6 fL (ref 80.0–100.0)
MPV: 10.5 fL (ref 7.5–12.5)
Platelets: 236 10*3/uL (ref 140–400)
RBC: 4.41 10*6/uL (ref 3.80–5.10)
RDW: 13.2 % (ref 11.0–15.0)
WBC: 6.9 10*3/uL (ref 3.8–10.8)

## 2021-07-26 LAB — LIPID PANEL
Cholesterol: 171 mg/dL (ref <200)
HDL: 55 mg/dL (ref >=50)
LDL Cholesterol (Calc): 97 mg/dL (calc)
Non-HDL Cholesterol (Calc): 116 mg/dL (calc) (ref <130)
Total CHOL/HDL Ratio: 3.1 (calc) (ref <5.0)
Triglycerides: 99 mg/dL (ref <150)

## 2021-07-26 LAB — COMPLETE METABOLIC PANEL WITH GFR
AG Ratio: 1 (calc) (ref 1.0–2.5)
ALT: 8 U/L (ref 6–29)
AST: 16 U/L (ref 10–35)
Albumin: 3.5 g/dL — ABNORMAL LOW (ref 3.6–5.1)
Alkaline phosphatase (APISO): 79 U/L (ref 37–153)
BUN/Creatinine Ratio: 14 (calc) (ref 6–22)
BUN: 16 mg/dL (ref 7–25)
CO2: 29 mmol/L (ref 20–32)
Calcium: 9.3 mg/dL (ref 8.6–10.4)
Chloride: 102 mmol/L (ref 98–110)
Creat: 1.18 mg/dL — ABNORMAL HIGH (ref 0.60–0.95)
Globulin: 3.6 g/dL (calc) (ref 1.9–3.7)
Glucose, Bld: 127 mg/dL — ABNORMAL HIGH (ref 65–99)
Potassium: 4.6 mmol/L (ref 3.5–5.3)
Sodium: 138 mmol/L (ref 135–146)
Total Bilirubin: 0.5 mg/dL (ref 0.2–1.2)
Total Protein: 7.1 g/dL (ref 6.1–8.1)
eGFR: 46 mL/min/{1.73_m2} — ABNORMAL LOW (ref >=60)

## 2021-08-15 ENCOUNTER — Other Ambulatory Visit: Payer: Self-pay

## 2021-08-15 ENCOUNTER — Ambulatory Visit (INDEPENDENT_AMBULATORY_CARE_PROVIDER_SITE_OTHER): Payer: Medicare Other | Admitting: Cardiology

## 2021-08-15 VITALS — BP 146/68 | HR 63 | Ht 62.0 in | Wt 229.6 lb

## 2021-08-15 DIAGNOSIS — R0789 Other chest pain: Secondary | ICD-10-CM

## 2021-08-15 DIAGNOSIS — I739 Peripheral vascular disease, unspecified: Secondary | ICD-10-CM | POA: Diagnosis not present

## 2021-08-15 DIAGNOSIS — I10 Essential (primary) hypertension: Secondary | ICD-10-CM

## 2021-08-15 DIAGNOSIS — Z7189 Other specified counseling: Secondary | ICD-10-CM

## 2021-08-15 NOTE — Patient Instructions (Signed)
Medication Instructions:  1) RESTART: Valsartan 40 mg daily   *If you need a refill on your cardiac medications before your next appointment, please call your pharmacy*   Lab Work: None ordered today   Testing/Procedures: None ordered today   Follow-Up: At [facility], you and your health needs are our priority.  As part of our continuing mission to provide you with exceptional heart care, we have created designated Provider Care Teams.  These Care Teams include your primary Cardiologist (physician) and Advanced Practice Providers (APPs -  Physician Assistants and Nurse Practitioners) who all work together to provide you with the care you need, when you need it.  We recommend signing up for the patient portal called "MyChart".  Sign up information is provided on this After Visit Summary.  MyChart is used to connect with patients for Virtual Visits (Telemedicine).  Patients are able to view lab/test results, encounter notes, upcoming appointments, etc.  Non-urgent messages can be sent to your provider as well.   To learn more about what you can do with MyChart, go to [email or web address].    Your next appointment:   1 year(s)  The format for your next appointment:   In Person  Provider:   Buford Dresser, MD

## 2021-08-15 NOTE — Progress Notes (Signed)
Cardiology Office Note:    Date:  08/15/2021   ID:  Linda Frye, DOB 03-06-1941, MRN [id number]  PCP:  Linda Ebbs, MD  Cardiologist:  Buford Dresser, MD  Referring MD: Linda Ebbs, MD   CC: follow up  History of Present Illness:    Linda Frye is a 81 y.o. female with a hx of type II diabetes, hypertension, CAD who is seen for follow up today. I initially met her 07/30/19 as a new consult at the request of Linda Ebbs, MD for the evaluation and management of hypertension and syncope.  Today: She is accompanied by her daughter Linda Frye, who is also a patient of mine. They have back to back appointments today and they wish to be seen together.  She has central sternal chest tenderness, reproducible on exam. Also has occasional pain that improves with belching.  Told in the past she has a murmur. I do not hear today. Reviewed her prior echo. Also told she has plaque in her arteries before. We reviewed what this means.  Only rare hematochezia now, has internal hemorrhoids.  She stopped her valsartan as she was worried about her diastolic number was too low. She has felt no different since stopping it. We discussed goal for BP today, I recommended restarting.  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  Current Medications: Current Outpatient Medications on File Prior to Visit  Medication Sig   acetaminophen (TYLENOL) 500 MG tablet Take 500 mg by mouth daily as needed for headache.   aspirin EC 81 MG tablet Take 81 mg by mouth daily.   cholecalciferol (VITAMIN D3) 25 MCG (1000 UT) tablet Take 1,000 Units by mouth daily.   famotidine (PEPCID) 40 MG  tablet Take 40 mg by mouth at bedtime.   furosemide (LASIX) 20 MG tablet Take 1 tablet (20 mg total) by mouth daily.   nateglinide (STARLIX) 120 MG tablet Take 120 mg by mouth 3 (three) times daily with meals.   nitroGLYCERIN (NITROSTAT) 0.4 MG SL tablet Place 1 tablet under the tongue every 5 (five) minutes as needed for chest pain.   sertraline (ZOLOFT) 50 MG tablet Take 50 mg by mouth daily.   simvastatin (ZOCOR) 40 MG tablet Take 40 mg by mouth daily.   valsartan (DIOVAN) 40 MG tablet Take 1 tablet (40 mg total) by mouth at bedtime.   vitamin B-12 (CYANOCOBALAMIN) 500 MCG tablet Take 500 mcg by mouth daily.   No current facility-administered medications on file prior to visit.     Allergies:   Epinephrine   Social History   Tobacco Use   Smoking status: Never   Smokeless tobacco: Never  Vaping Use   Vaping Use: Never used  Substance Use Topics   Alcohol use: Not Currently   Drug use: Never    Family History: Father had a stroke, passed away age 27. No other heart disease or CVA that she knows of.  ROS:   Please see the history of present illness.   Additional pertinent ROS otherwise unremarkable.   EKGs/Labs/Other Studies Reviewed:    The following studies were reviewed today:  ABI  Doppler 11/26/2019: Right: Resting right ankle-brachial index indicates noncompressible right  lower extremity arteries. The right toe-brachial index is normal. RT great  toe pressure = 155 mmHg.   Left: Resting left ankle-brachial index indicates noncompressible left  lower extremity arteries. The left toe-brachial index is normal. LT Great  toe pressure = 174 mmHg.   Echo 08/12/19 1. Left ventricular ejection fraction, by visual estimation, is 65 to  70%. The left ventricle has hyperdynamic function. There is no left  ventricular hypertrophy.   2. Left ventricular diastolic parameters are consistent with Grade I  diastolic dysfunction (impaired relaxation).   3. The left ventricle  has no regional wall motion abnormalities.   4. Global right ventricle has normal systolic function.The right  ventricular size is normal. No increase in right ventricular wall  thickness.   5. Left atrial size was normal.   6. Right atrial size was normal.   7. Moderate mitral annular calcification.   8. The mitral valve is normal in structure. No evidence of mitral valve  regurgitation. No evidence of mitral stenosis.   9. The tricuspid valve is normal in structure.  10. The tricuspid valve is normal in structure. Tricuspid valve  regurgitation is trivial.  11. The aortic valve is normal in structure. Aortic valve regurgitation is  not visualized. No evidence of aortic valve sclerosis or stenosis.  12. The pulmonic valve was normal in structure. Pulmonic valve  regurgitation is not visualized.  13. Normal pulmonary artery systolic pressure.  14. The inferior vena cava is normal in size with greater than 50%  respiratory variability, suggesting right atrial pressure of 3 mmHg.   EKG:  EKG is personally reviewed.   08/15/21: NSR at 63 bpm 01/27/2019: NSR at 64 bpm 04/26/2020: NSR  Recent Labs: 07/25/2021: ALT 8; BUN 16; Creat 1.18; Hemoglobin 13.4; Platelets 236; Potassium 4.6; Sodium 138; TSH 2.68  Recent Lipid Panel    Component Value Date/Time   CHOL 171 07/25/2021 0000   TRIG 99 07/25/2021 0000   HDL 55 07/25/2021 0000   CHOLHDL 3.1 07/25/2021 0000   LDLCALC 97 07/25/2021 0000    Physical Exam:    VS:  BP (!) 146/68 (BP Location: Left Arm, Patient Position: Sitting, Cuff Size: Large)    Pulse 63    Ht 5' 2"  (1.575 m)    Wt 229 lb 9.6 oz (104.1 kg)    BMI 41.99 kg/m     Wt Readings from Last 3 Encounters:  08/15/21 229 lb 9.6 oz (104.1 kg)  01/26/21 225 lb 9.6 oz (102.3 kg)  09/01/20 220 lb (99.8 kg)    GEN: Well nourished, well developed in no acute distress HEENT: Normal, moist mucous membranes NECK: No JVD CARDIAC: regular rhythm, normal S1 and S2, no rubs or  gallops. No murmur. Tender to palpation across left chest and left epigastrium. She reports this is the same pain she has been having VASCULAR: Radial and DP pulses 2+ bilaterally. No carotid bruits RESPIRATORY:  Clear to auscultation without rales, wheezing or rhonchi  ABDOMEN: Soft, non-tender, non-distended MUSCULOSKELETAL:  Ambulates independently SKIN: Warm and dry, trivial LE edema (L>R) NEUROLOGIC:  Alert and oriented x 3. No focal neuro deficits noted. PSYCHIATRIC:  Normal affect   ASSESSMENT:    1. Benign essential HTN   2. Atypical chest pain   3. PVD (peripheral vascular disease) ([place])   4. Cardiac risk counseling   5. Counseling on health promotion and disease prevention    PLAN:  Atypical chest/upper abdominal pain: -tender on palpation today. ECG unremarkable -low suspicion for cardiac etiology, but did discuss red flag warning signs that need immediate medical attention -she has been told in the past that this pain was due to hiatal hernia and gallstones  Hypertension: elevated today -continue furosemide 20 mg -restart valsartan 40 mg  Peripheral vascular disease Intermittent LE edema -continue aspirin, simvastatin -PVD followed by Dr. Oneida Alar -has obesity, BMI 41, as additional risk factor -feels compression stockings make symptoms worse  Cardiac risk counseling and prevention recommendations: -recommend heart healthy/Mediterranean diet, with whole grains, fruits, vegetable, fish, lean meats, nuts, and olive oil. Limit salt. -recommend moderate walking, 3-5 times/week for 30-50 minutes each session. Aim for at least 150 minutes.week. Goal should be pace of 3 miles/hours, or walking 1.5 miles in 30 minutes -recommend avoidance of tobacco products. Avoid excess alcohol.  Plan for follow up: 1 year or sooner as needed.  Medication Adjustments/Labs and Tests Ordered: Current medicines are reviewed at length with the patient today.  Concerns regarding medicines  are outlined above.  Orders Placed This Encounter  Procedures   EKG 12-Lead   No orders of the defined types were placed in this encounter.  Patient Instructions  Medication Instructions:  1) RESTART: Valsartan 40 mg daily   *If you need a refill on your cardiac medications before your next appointment, please call your pharmacy*   Lab Work: None ordered today   Testing/Procedures: None ordered today   Follow-Up: At [facility], you and your health needs are our priority.  As part of our continuing mission to provide you with exceptional heart care, we have created designated Provider Care Teams.  These Care Teams include your primary Cardiologist (physician) and Advanced Practice Providers (APPs -  Physician Assistants and Nurse Practitioners) who all work together to provide you with the care you need, when you need it.  We recommend signing up for the patient portal called "MyChart".  Sign up information is provided on this After Visit Summary.  MyChart is used to connect with patients for Virtual Visits (Telemedicine).  Patients are able to view lab/test results, encounter notes, upcoming appointments, etc.  Non-urgent messages can be sent to your provider as well.   To learn more about what you can do with MyChart, go to [email or web address].    Your next appointment:   1 year(s)  The format for your next appointment:   In Person  Provider:   Buford Dresser, MD        Signed, Buford Dresser, MD PhD 08/15/2021    [place]

## 2021-08-21 ENCOUNTER — Encounter (HOSPITAL_BASED_OUTPATIENT_CLINIC_OR_DEPARTMENT_OTHER): Payer: Self-pay | Admitting: Cardiology

## 2021-10-23 IMAGING — MG MM DIGITAL SCREENING BILAT W/ TOMO AND CAD
6 of 12 series · 6 of 36 positions shown · non-contrast
Comparison: Previous exam(s).

ACR Breast Density Category a: The breast tissue is almost entirely
fatty.

CLINICAL DATA: Screening.

EXAM:
DIGITAL SCREENING BILATERAL MAMMOGRAM WITH TOMOSYNTHESIS AND CAD
TECHNIQUE: Bilateral screening digital craniocaudal and mediolateral oblique
mammograms were obtained. Bilateral screening digital breast
tomosynthesis was performed. The images were evaluated with
computer-aided detection.

[L CC synth-2D (1 of 2)]
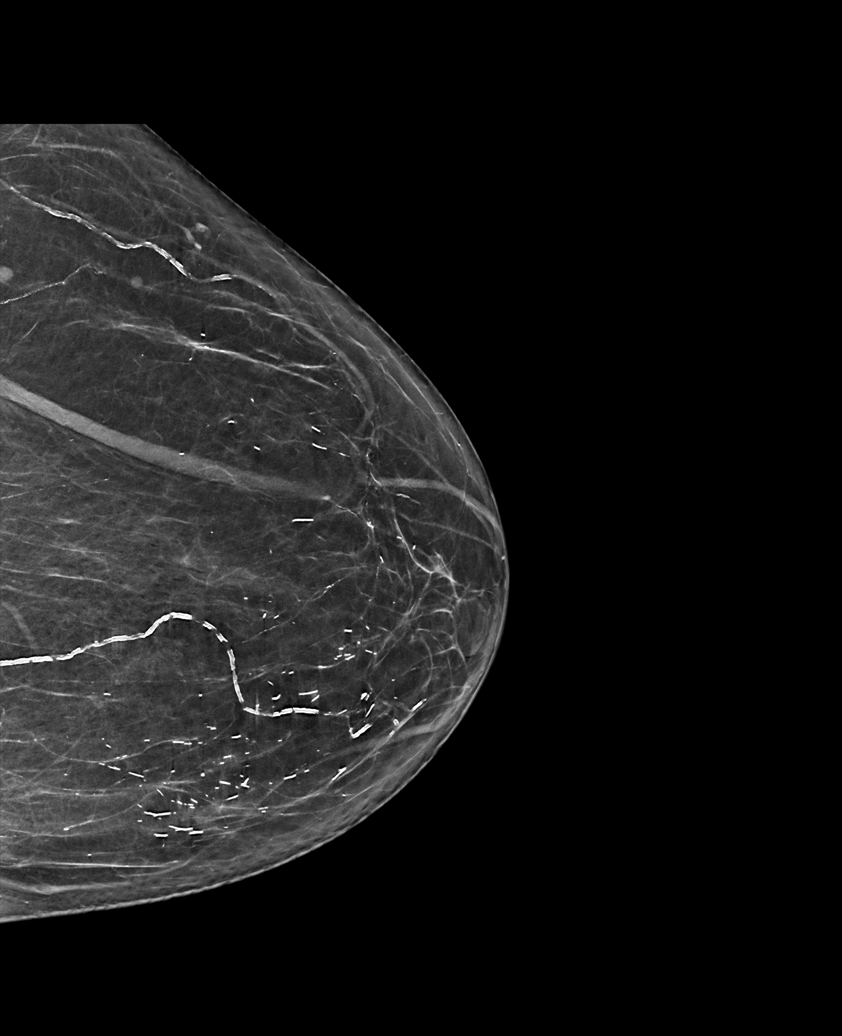

[L MLO synth-2D]
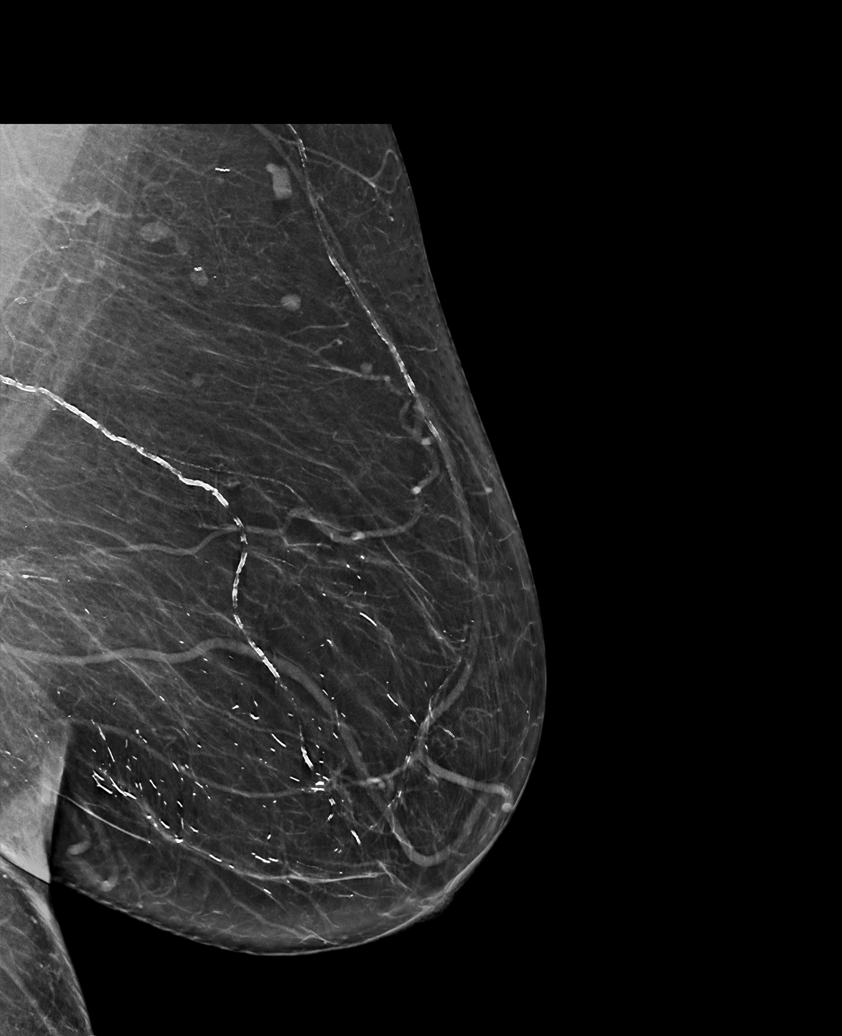

[L CC synth-2D (2 of 2)]
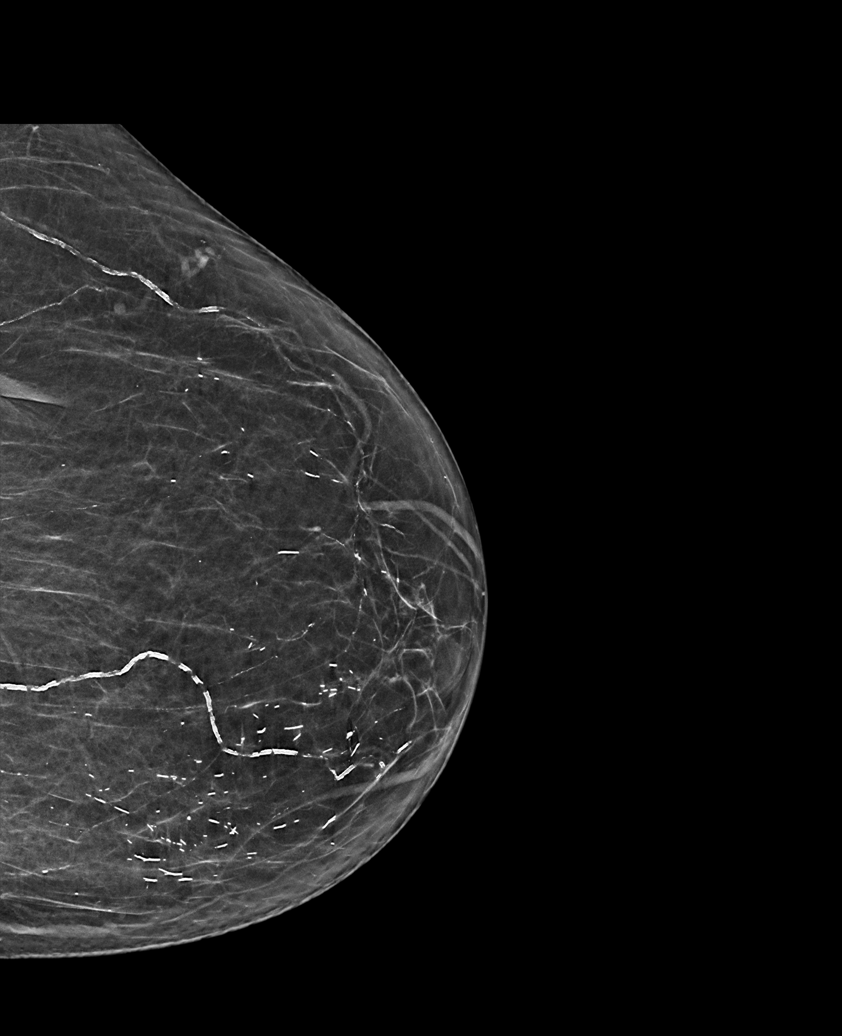

[R MLO synth-2D (1 of 2)]
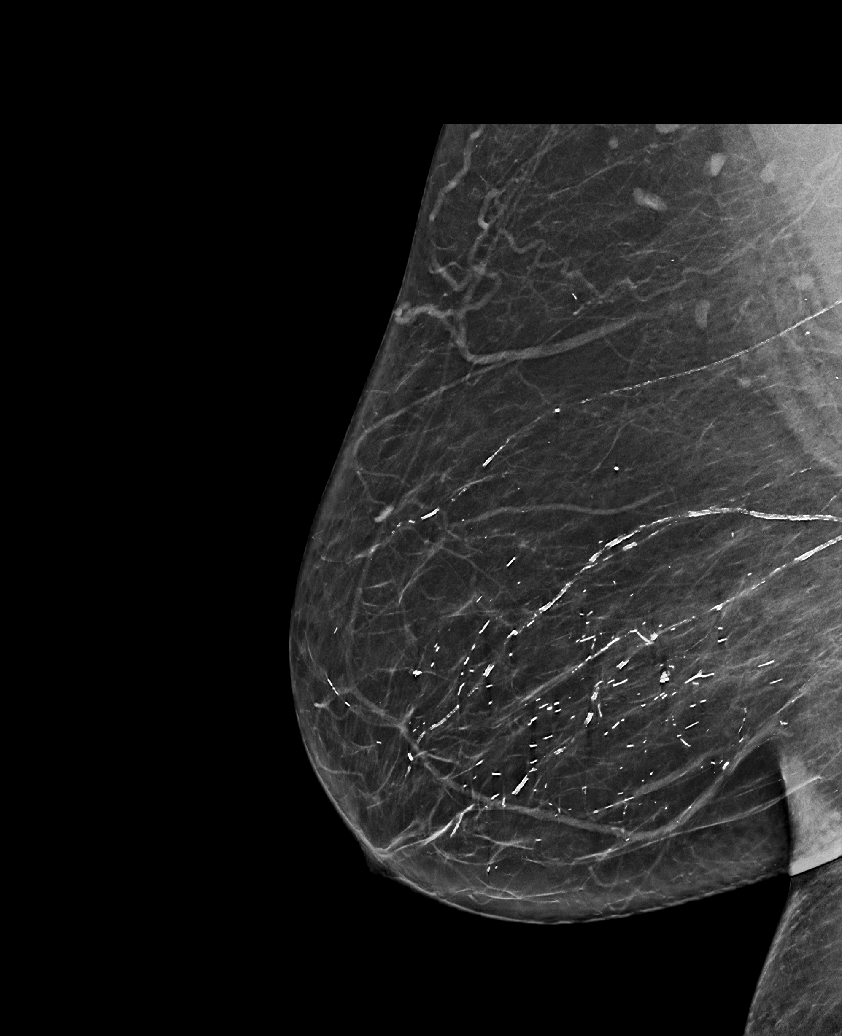

[R MLO synth-2D (2 of 2)]
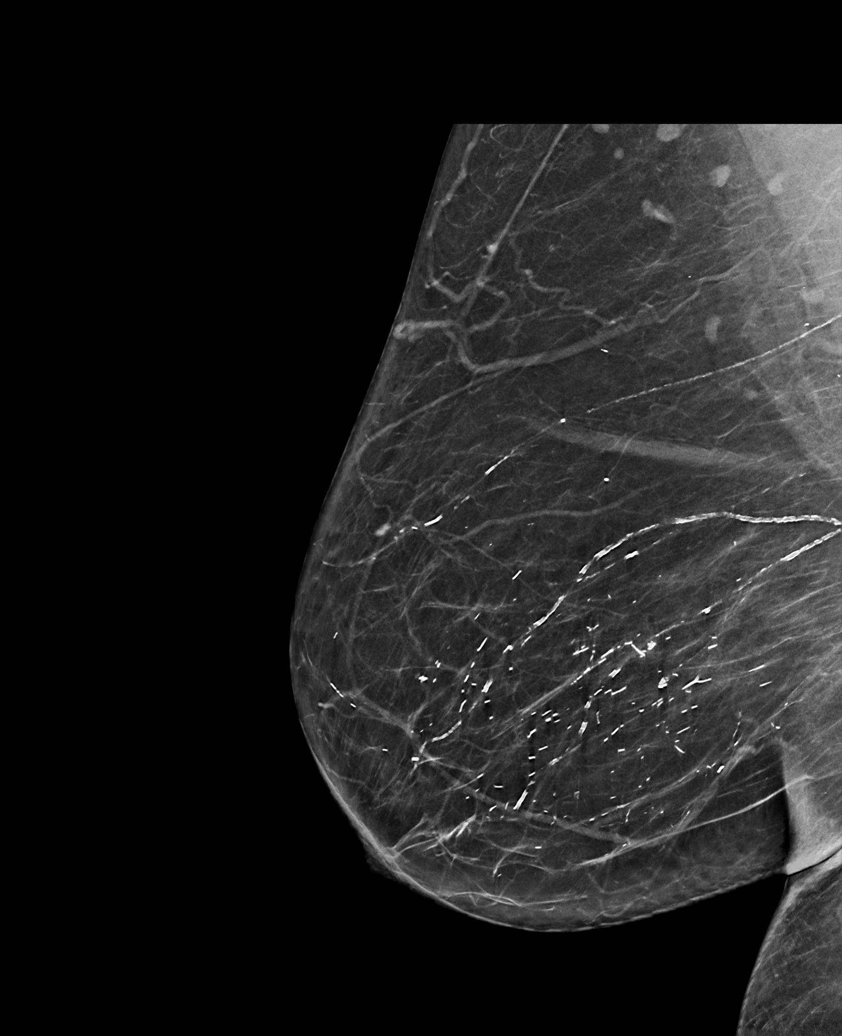

[R CC synth-2D]
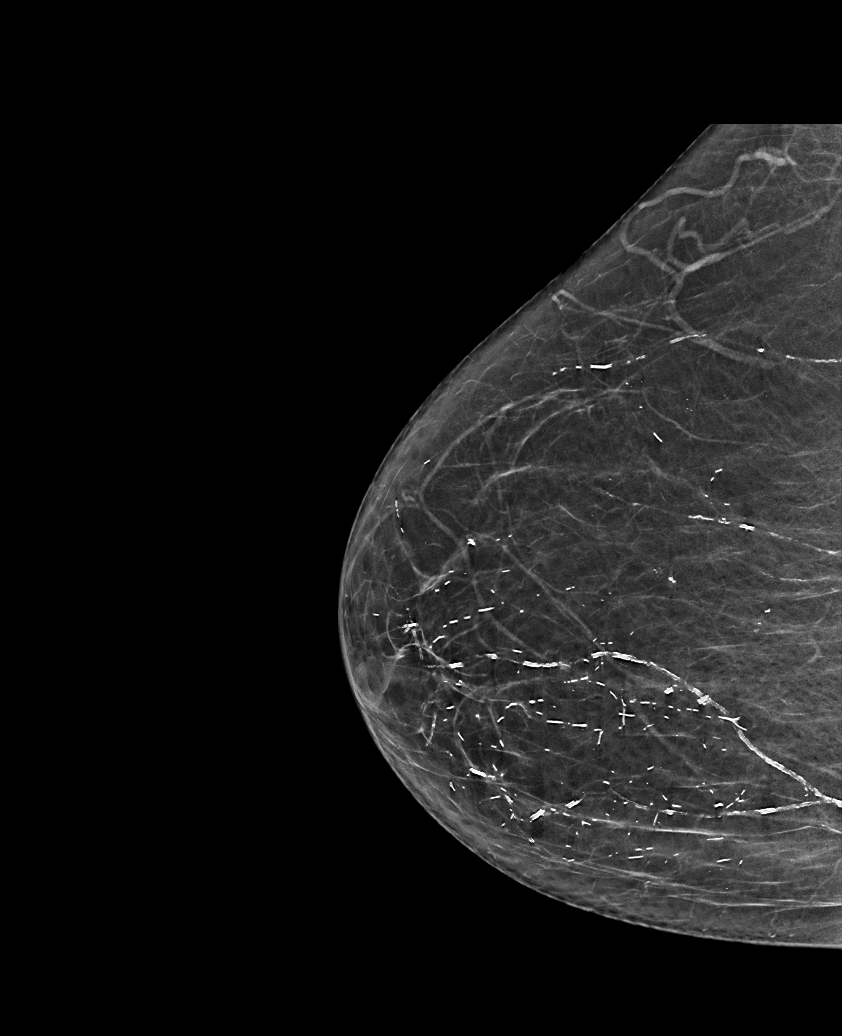

[6 of 36 positions shown; findings below may reference images not displayed]

FINDINGS: There are no findings suspicious for malignancy. The images were
evaluated with computer-aided detection.
IMPRESSION: No mammographic evidence of malignancy. A result letter of this
screening mammogram will be mailed directly to the patient.

RECOMMENDATION:
Screening mammogram in one year. (Code:JP-J-DD5)

BI-RADS CATEGORY  1: Negative.

## 2021-12-21 ENCOUNTER — Other Ambulatory Visit: Payer: Self-pay | Admitting: Internal Medicine

## 2021-12-21 DIAGNOSIS — Z1231 Encounter for screening mammogram for malignant neoplasm of breast: Secondary | ICD-10-CM

## 2022-01-25 ENCOUNTER — Ambulatory Visit: Payer: Medicare Other

## 2022-02-01 ENCOUNTER — Ambulatory Visit
Admission: RE | Admit: 2022-02-01 | Discharge: 2022-02-01 | Disposition: A | Discharge status: Home / Self Care | Payer: Medicare Other | Source: Ambulatory Visit | Attending: Internal Medicine | Admitting: Internal Medicine

## 2022-02-01 DIAGNOSIS — Z1231 Encounter for screening mammogram for malignant neoplasm of breast: Secondary | ICD-10-CM

## 2022-09-13 ENCOUNTER — Other Ambulatory Visit: Payer: Self-pay | Admitting: Internal Medicine

## 2022-09-14 LAB — LIPID PANEL
Cholesterol: 169 mg/dL (ref <200)
HDL: 53 mg/dL (ref >=50)
LDL Cholesterol (Calc): 91 mg/dL (calc)
Non-HDL Cholesterol (Calc): 116 mg/dL (calc) (ref <130)
Total CHOL/HDL Ratio: 3.2 (calc) (ref <5.0)
Triglycerides: 145 mg/dL (ref <150)

## 2022-09-14 LAB — COMPLETE METABOLIC PANEL WITH GFR
AG Ratio: 0.9 (calc) — ABNORMAL LOW (ref 1.0–2.5)
ALT: 6 U/L (ref 6–29)
AST: 19 U/L (ref 10–35)
Albumin: 3.6 g/dL (ref 3.6–5.1)
Alkaline phosphatase (APISO): 75 U/L (ref 37–153)
BUN/Creatinine Ratio: 12 (calc) (ref 6–22)
BUN: 14 mg/dL (ref 7–25)
CO2: 19 mmol/L — ABNORMAL LOW (ref 20–32)
Calcium: 9 mg/dL (ref 8.6–10.4)
Chloride: 105 mmol/L (ref 98–110)
Creat: 1.16 mg/dL — ABNORMAL HIGH (ref 0.60–0.95)
Globulin: 4 g/dL (calc) — ABNORMAL HIGH (ref 1.9–3.7)
Glucose, Bld: 148 mg/dL — ABNORMAL HIGH (ref 65–99)
Potassium: 4.8 mmol/L (ref 3.5–5.3)
Sodium: 141 mmol/L (ref 135–146)
Total Bilirubin: 0.6 mg/dL (ref 0.2–1.2)
Total Protein: 7.6 g/dL (ref 6.1–8.1)
eGFR: 47 mL/min/{1.73_m2} — ABNORMAL LOW (ref >=60)

## 2022-09-14 LAB — VITAMIN B12: Vitamin B-12: 1327 pg/mL — ABNORMAL HIGH (ref 200–1100)

## 2022-09-14 LAB — CBC

## 2022-09-14 LAB — TSH: TSH: 3.03 mIU/L (ref 0.40–4.50)

## 2022-09-14 LAB — FOLATE: Folate: 22.6 ng/mL

## 2022-09-29 ENCOUNTER — Emergency Department (HOSPITAL_COMMUNITY): Payer: Medicare Other

## 2022-09-29 ENCOUNTER — Other Ambulatory Visit: Payer: Self-pay

## 2022-09-29 ENCOUNTER — Emergency Department (HOSPITAL_COMMUNITY)
Admission: EM | Admit: 2022-09-29 | Discharge: 2022-09-29 | Disposition: A | Discharge status: Home / Self Care | Payer: Medicare Other | Attending: Emergency Medicine | Admitting: Emergency Medicine

## 2022-09-29 ENCOUNTER — Encounter (HOSPITAL_COMMUNITY): Payer: Self-pay

## 2022-09-29 DIAGNOSIS — I2510 Atherosclerotic heart disease of native coronary artery without angina pectoris: Secondary | ICD-10-CM | POA: Diagnosis not present

## 2022-09-29 DIAGNOSIS — E119 Type 2 diabetes mellitus without complications: Secondary | ICD-10-CM | POA: Insufficient documentation

## 2022-09-29 DIAGNOSIS — I10 Essential (primary) hypertension: Secondary | ICD-10-CM | POA: Insufficient documentation

## 2022-09-29 DIAGNOSIS — R0789 Other chest pain: Secondary | ICD-10-CM | POA: Diagnosis not present

## 2022-09-29 DIAGNOSIS — Z7982 Long term (current) use of aspirin: Secondary | ICD-10-CM | POA: Insufficient documentation

## 2022-09-29 DIAGNOSIS — K8010 Calculus of gallbladder with chronic cholecystitis without obstruction: Secondary | ICD-10-CM | POA: Diagnosis not present

## 2022-09-29 DIAGNOSIS — R1013 Epigastric pain: Secondary | ICD-10-CM

## 2022-09-29 LAB — CBC WITH DIFFERENTIAL/PLATELET
Abs Immature Granulocytes: 0.02 10*3/uL (ref 0.00–0.07)
Basophils Absolute: 0 10*3/uL (ref 0.0–0.1)
Basophils Relative: 0 %
Eosinophils Absolute: 0.2 10*3/uL (ref 0.0–0.5)
Eosinophils Relative: 2 %
HCT: 40 % (ref 36.0–46.0)
Hemoglobin: 13.4 g/dL (ref 12.0–15.0)
Immature Granulocytes: 0 %
Lymphocytes Relative: 33 %
Lymphs Abs: 2.5 10*3/uL (ref 0.7–4.0)
MCH: 30.6 pg (ref 26.0–34.0)
MCHC: 33.5 g/dL (ref 30.0–36.0)
MCV: 91.3 fL (ref 80.0–100.0)
Monocytes Absolute: 0.8 10*3/uL (ref 0.1–1.0)
Monocytes Relative: 10 %
Neutro Abs: 4 10*3/uL (ref 1.7–7.7)
Neutrophils Relative %: 55 %
Platelets: 243 10*3/uL (ref 150–400)
RBC: 4.38 MIL/uL (ref 3.87–5.11)
RDW: 14.3 % (ref 11.5–15.5)
WBC: 7.5 10*3/uL (ref 4.0–10.5)
nRBC: 0 % (ref 0.0–0.2)

## 2022-09-29 LAB — COMPREHENSIVE METABOLIC PANEL
ALT: 9 U/L (ref 0–44)
AST: 18 U/L (ref 15–41)
Albumin: 3.3 g/dL — ABNORMAL LOW (ref 3.5–5.0)
Alkaline Phosphatase: 72 U/L (ref 38–126)
Anion gap: 8 (ref 5–15)
BUN: 14 mg/dL (ref 8–23)
CO2: 26 mmol/L (ref 22–32)
Calcium: 8.7 mg/dL — ABNORMAL LOW (ref 8.9–10.3)
Chloride: 101 mmol/L (ref 98–111)
Creatinine, Ser: 1.1 mg/dL — ABNORMAL HIGH (ref 0.44–1.00)
GFR, Estimated: 50 mL/min — ABNORMAL LOW (ref >60)
Glucose, Bld: 114 mg/dL — ABNORMAL HIGH (ref 70–99)
Potassium: 3.8 mmol/L (ref 3.5–5.1)
Sodium: 135 mmol/L (ref 135–145)
Total Bilirubin: 0.9 mg/dL (ref 0.3–1.2)
Total Protein: 7.4 g/dL (ref 6.5–8.1)

## 2022-09-29 LAB — TROPONIN I (HIGH SENSITIVITY)
Troponin I (High Sensitivity): 11 ng/L (ref <18)
Troponin I (High Sensitivity): 6 ng/L (ref <18)

## 2022-09-29 LAB — LIPASE, BLOOD: Lipase: 18 U/L (ref 11–51)

## 2022-09-29 MED ORDER — IOHEXOL 300 MG/ML  SOLN
100.0000 mL | Freq: Once | INTRAMUSCULAR | Status: AC | PRN
  Administered 2022-09-29: 100 mL via INTRAVENOUS

## 2022-09-29 NOTE — ED Notes (Signed)
Pt taken for imaging.

## 2022-09-29 NOTE — ED Provider Notes (Addendum)
[place] Provider Note   CSN: [id number] Arrival date & time: 09/29/22  1713     History  Chief Complaint  Patient presents with   Chest Pain    Linda Frye is a 82 y.o. female.  Patient with a 1 week history of epigastric abdominal pain left anterior chest pain that comes and goes.  Does not radiate into the neck or down the arm.  Associated with diaphoresis and some shortness of breath no known cardiac history.  Patient states she has had pain like this in the past and usually takes Tylenol or aspirin to relieve it.  She has been seen by cardiology in the past for this.  Patient denies any stents or any heart surgery.  Patient is on aspirin daily not on any other blood thinners.  Patient is also supposed to be on Pepcid.  Patient states that the chest pain does not last more than 15 minutes.  Past medical history sniffing for diabetes coronary disease hypertension diverticulitis.  Patient is never used tobacco products.  Chart review shows that patient was last seen by cardiology at [facility] in February 2023.  She had cardiac risk factors and they said atypical chest pain at that time.  Patient had an echocardiogram in 2021.       Home Medications Prior to Admission medications   Medication Sig Start Date End Date Taking? Authorizing Provider  acetaminophen (TYLENOL) 500 MG tablet Take 500 mg by mouth daily as needed for headache.   Yes [provider]  aspirin EC 81 MG tablet Take 81 mg by mouth daily.   Yes [provider]  cholecalciferol (VITAMIN D3) 25 MCG (1000 UT) tablet Take 1,000 Units by mouth daily.   Yes [provider]  famotidine (PEPCID) 40 MG tablet Take 40 mg by mouth at bedtime. 05/08/21  Yes [provider]  furosemide (LASIX) 20 MG tablet Take 1 tablet (20 mg total) by mouth daily. 05/16/19  Yes Isla Pence, MD  nateglinide (STARLIX) 120 MG tablet Take 120 mg by  mouth 3 (three) times daily with meals.   Yes [provider]  nitroGLYCERIN (NITROSTAT) 0.4 MG SL tablet Place 1 tablet under the tongue every 5 (five) minutes as needed for chest pain. 07/24/19  Yes [provider]  sertraline (ZOLOFT) 50 MG tablet Take 50 mg by mouth daily. 01/03/21  Yes [provider]  simvastatin (ZOCOR) 40 MG tablet Take 40 mg by mouth daily.   Yes [provider]  vitamin B-12 (CYANOCOBALAMIN) 500 MCG tablet Take 500 mcg by mouth daily.   Yes [provider]      Allergies    Epinephrine    Review of Systems   Review of Systems  Constitutional:  Negative for chills and fever.  HENT:  Negative for ear pain and sore throat.   Eyes:  Negative for pain and visual disturbance.  Respiratory:  Negative for cough and shortness of breath.   Cardiovascular:  Positive for chest pain. Negative for palpitations.  Gastrointestinal:  Positive for abdominal pain. Negative for vomiting.  Genitourinary:  Negative for dysuria and hematuria.  Musculoskeletal:  Negative for arthralgias and back pain.  Skin:  Negative for color change and rash.  Neurological:  Negative for seizures and syncope.  All other systems reviewed and are negative.   Physical Exam Updated Vital Signs BP (!) 126/94   Pulse 61   Temp 97.6 F (36.4 C) (Oral)  Resp (!) 22   Ht 1.575 m (5\' 2" )   Wt 95.3 kg   SpO2 98%   BMI 38.41 kg/m  Physical Exam Vitals and nursing note reviewed.  Constitutional:      General: She is not in acute distress.    Appearance: Normal appearance. She is well-developed.  HENT:     Head: Normocephalic and atraumatic.  Eyes:     Conjunctiva/sclera: Conjunctivae normal.  Cardiovascular:     Rate and Rhythm: Normal rate and regular rhythm.     Heart sounds: No murmur heard. Pulmonary:     Effort: Pulmonary effort is normal. No respiratory distress.     Breath sounds: Normal breath sounds.  Chest:     Chest wall: No  tenderness.  Abdominal:     Palpations: Abdomen is soft.     Tenderness: There is no abdominal tenderness. There is no guarding.  Musculoskeletal:        General: No swelling.     Cervical back: Neck supple.  Skin:    General: Skin is warm and dry.     Capillary Refill: Capillary refill takes less than 2 seconds.  Neurological:     General: No focal deficit present.     Mental Status: She is alert and oriented to person, place, and time.  Psychiatric:        Mood and Affect: Mood normal.     ED Results / Procedures / Treatments   Labs (all labs ordered are listed, but only abnormal results are displayed) Labs Reviewed  COMPREHENSIVE METABOLIC PANEL - Abnormal; Notable for the following components:      Result Value   Glucose, Bld 114 (*)    Creatinine, Ser 1.10 (*)    Calcium 8.7 (*)    Albumin 3.3 (*)    GFR, Estimated 50 (*)    All other components within normal limits  CBC WITH DIFFERENTIAL/PLATELET  LIPASE, BLOOD  TROPONIN I (HIGH SENSITIVITY)  TROPONIN I (HIGH SENSITIVITY)    EKG EKG Interpretation  Date/Time:  Saturday September 29 2022 17:35:46 EDT Ventricular Rate:  57 PR Interval:  90 QRS Duration: 77 QT Interval:  422 QTC Calculation: 411 R Axis:   0 Text Interpretation: Sinus or ectopic atrial rhythm Short PR interval Confirmed by Fredia Sorrow [id number] on 09/29/2022 5:41:11 PM  Radiology CT Abdomen Pelvis W Contrast  Result Date: 09/29/2022 CLINICAL DATA:  Abdominal pain, acute, nonlocalized Mostly epigastric abdominal pain history of diverticulitis EXAM: CT ABDOMEN AND PELVIS WITH CONTRAST TECHNIQUE: Multidetector CT imaging of the abdomen and pelvis was performed using the standard protocol following bolus administration of intravenous contrast. RADIATION DOSE REDUCTION: This exam was performed according to the departmental dose-optimization program which includes automated exposure control, adjustment of the mA and/or kV according to patient size and/or  use of iterative reconstruction technique. CONTRAST:  187mL OMNIPAQUE IOHEXOL 300 MG/ML  SOLN COMPARISON:  CT abdomen pelvis 05/16/2019 FINDINGS: Lower chest: Interlobular septal wall thickening. Hepatobiliary: No focal liver abnormality. Calcified gallstone noted within the gallbladder lumen. No gallbladder wall thickening or pericholecystic fluid. No biliary dilatation. Pancreas: Diffusely atrophic. No focal lesion. Otherwise normal pancreatic contour. No surrounding inflammatory changes. No main pancreatic ductal dilatation. Spleen: Normal in size without focal abnormality. Adrenals/Urinary Tract: No adrenal nodule bilaterally. Bilateral kidneys enhance symmetrically. Fluid density lesion of the right kidney likely represent simple renal cysts. Simple renal cysts, in the absence of clinically indicated signs/symptoms, require no independent follow-up. No hydronephrosis. No hydroureter. The urinary bladder is  unremarkable. On delayed imaging, there is no urothelial wall thickening and there are no filling defects in the opacified portions of the bilateral collecting systems or ureters. Stomach/Bowel: Stomach is within normal limits. No evidence of bowel wall thickening or dilatation. Diffuse colonic diverticulosis. Appendix appears normal. Vascular/Lymphatic: No abdominal aorta or iliac aneurysm. Mild atherosclerotic plaque of the aorta and its branches. No abdominal, pelvic, or inguinal lymphadenopathy. Reproductive: Status post hysterectomy. No adnexal masses. Other: No intraperitoneal free fluid. No intraperitoneal free gas. No organized fluid collection. Musculoskeletal: No abdominal wall hernia or abnormality. No suspicious lytic or blastic osseous lesions. No acute displaced fracture. Grade 1 anterolisthesis of L4 on L5. Sacroiliac joint degenerative changes. IMPRESSION: 1. Cholelithiasis with no CT evidence of acute cholecystitis. 2. Colonic diverticulosis with no acute diverticulitis. 3. Mild pulmonary  edema. 4.  Aortic Atherosclerosis (ICD10-I70.0). Electronically Signed   By: Iven Finn M.D.   On: 09/29/2022 20:10   DG Chest Port 1 View  Result Date: 09/29/2022 CLINICAL DATA:  Chest pain EXAM: PORTABLE CHEST 1 VIEW COMPARISON:  08/23/2020. FINDINGS: Elevated right hemidiaphragm. Pulmonary vascular congestion. No pneumothorax or pleural effusion. IMPRESSION: Elevated right hemidiaphragm.  Pulmonary vascular congestion. Electronically Signed   By: Sammie Bench M.D.   On: 09/29/2022 19:06    Procedures Procedures    Medications Ordered in ED Medications  iohexol (OMNIPAQUE) 300 MG/ML solution 100 mL (100 mLs Intravenous Contrast Given 09/29/22 1950)    ED Course/ Medical Decision Making/ A&P                             Medical Decision Making Amount and/or Complexity of Data Reviewed Labs: ordered. Radiology: ordered.  Risk Prescription drug management.  Chest pain seems somewhat atypical but will get delta troponins.  Also will get abdominal labs.  And will get a CT of the abdomen because a lot of the area that she points to his epigastric area.  CBC normal.  No leukocytosis troponins x 2 normal first 1 was 11-second 1 was 6.  Lipase normal complete metabolic panel GFR 50 but liver function test are normal including total bili anion gap normal.  CT abdomen and pelvis shows cholelithiasis without evidence of acute cholecystitis patient knew she had gallstone her primary care doctor and recommend not to do anything about it.  There is evidence of diverticulosis but no acute diverticulitis there is mild pulmonary edema.  Patient not significantly short of breath.  Chest x-ray some pulmonary vascular congestion.  No evidence of acute cardiac event patient can follow back up with [facility].  Would recommend patient talk to general surgery about the cholelithiasis.  Will refer her for consult with them.  And also for her to go back and see her primary care doctor.  No  indications for admission at this point in time.  Patient will return for any new or worse symptoms.  Final Clinical Impression(s) / ED Diagnoses Final diagnoses:  Atypical chest pain  Epigastric pain  Calculus of gallbladder with cholecystitis without biliary obstruction, unspecified cholecystitis acuity    Rx / DC Orders ED Discharge Orders     None         Fredia Sorrow, MD 09/29/22 Merrily Pew    Fredia Sorrow, MD 09/29/22 2209

## 2022-09-29 NOTE — Discharge Instructions (Addendum)
Workup here today without any acute findings.  Still have gallstones.  But they do not appear to be infected.  Follow-up with your primary care doctor.  And recommended to follow-up with cardiology for the chest pain.  Recommend he follow back up with your primary care doctor about the gallstones for consideration for gallbladder removal.  Also given you Dr. Arnoldo Morale information but you can follow-up locally Dr. Arnoldo Morale is a general surgeon.   Workup for the chest pain today without any acute cardiac findings.  Follow-up with cardiology is still required.

## 2022-09-29 NOTE — ED Triage Notes (Signed)
Pt denies CP at this time 

## 2022-09-29 NOTE — ED Triage Notes (Signed)
Pt states she has been having L side chest pain x 1 week. Denies radiation, SHOB, diaphoresis. No known cardiac hx per pt.   Says this happens occasionally and she will take aspirin or tylenol to relieve symptoms.   Took 4 aspirin PTA with some relief.

## 2023-01-02 ENCOUNTER — Other Ambulatory Visit: Payer: Self-pay | Admitting: Internal Medicine

## 2023-01-02 DIAGNOSIS — Z1231 Encounter for screening mammogram for malignant neoplasm of breast: Secondary | ICD-10-CM

## 2023-01-03 ENCOUNTER — Encounter (HOSPITAL_BASED_OUTPATIENT_CLINIC_OR_DEPARTMENT_OTHER): Payer: Self-pay | Admitting: Cardiology

## 2023-01-03 ENCOUNTER — Ambulatory Visit (INDEPENDENT_AMBULATORY_CARE_PROVIDER_SITE_OTHER): Payer: Medicare Other | Admitting: Cardiology

## 2023-01-03 VITALS — BP 118/78 | HR 69 | Ht 62.0 in | Wt 208.0 lb

## 2023-01-03 DIAGNOSIS — R600 Localized edema: Secondary | ICD-10-CM

## 2023-01-03 DIAGNOSIS — R0789 Other chest pain: Secondary | ICD-10-CM | POA: Diagnosis not present

## 2023-01-03 DIAGNOSIS — I739 Peripheral vascular disease, unspecified: Secondary | ICD-10-CM

## 2023-01-03 DIAGNOSIS — I10 Essential (primary) hypertension: Secondary | ICD-10-CM | POA: Diagnosis not present

## 2023-01-03 NOTE — Progress Notes (Signed)
Cardiology Office Note:  .    Date:  01/03/2023  ID:  Linda Frye, DOB 11/17/1940, MRN [id number] PCP: Fleet Contras, MD  [facility] Providers Cardiologist:  Jodelle Red, MD     History of Present Illness: .    Linda Frye is a 82 y.o. female with a hx of type II diabetes, hypertension, CAD who is seen for follow up today. I initially met her 07/30/19 as a new consult at the request of Fleet Contras, MD for the evaluation and management of hypertension and syncope.   At her visit in 08/2021, she complained of central chest tenderness that was reproducible on exam. Also had occasional pain that improved with belching. She had stopped her valsartan as she was worried about her diastolic number being too low. Her blood pressure was elevated in the office so we restarted valsartan 40 mg.  Today, she is concerned about a strange chest pain that has been ongoing for about 2 weeks, occurring multiple times almost every day now. Described as a nagging, dull pain always localized to her central chest, never radiates. Typically lasts for a brief time without associated symptoms. Sometimes her chest pain will subside after she belches. In the past she has had similar episodes of chest pain on and off; however her recent chest pain seems slightly worse. Not certain if it is similar to her episodes in March 2024; we reviewed her ED visit from that time.   At other times, independent of her chest pain, she complains of "trouble with her elbow". Also notes that she has severe carpal tunnel with prior surgery in her left hand and some tingling sensations.  Every day she is also struggling with a slight pain in her stomach  At times she is aware of significant swelling in her left leg, especially around her left ankle.  She denies any palpitations, shortness of breath, lightheadedness, headaches, orthopnea, or PND.  ROS:  Please see the history of present illness. ROS otherwise  negative except as noted.  (+) Central, dull chest pain (+) Left elbow discomfort (+) Tingling sensations of left hand (+) Gastric pain (+) Intermittent left LE edema  Studies Reviewed: Marland Kitchen       ECG not ordered today  Physical Exam:    VS:  BP 118/78   Pulse 69   Ht 5\' 2"  (1.575 m)   Wt 208 lb (94.3 kg)   BMI 38.04 kg/m    Wt Readings from Last 3 Encounters:  01/03/23 208 lb (94.3 kg)  09/29/22 210 lb (95.3 kg)  08/15/21 229 lb 9.6 oz (104.1 kg)    GEN: Well nourished, well developed in no acute distress HEENT: Normal, moist mucous membranes NECK: No JVD CARDIAC: regular rhythm, normal S1 and S2, no rubs or gallops. No murmur. Tender to palpation across her mid epigastric region, which she states reproduces her pain VASCULAR: Radial and DP pulses 2+ bilaterally. No carotid bruits RESPIRATORY:  Clear to auscultation without rales, wheezing or rhonchi  ABDOMEN: Soft, non-tender, non-distended MUSCULOSKELETAL:  Ambulates independently. Central chest tenderness on palpation SKIN: Warm and dry, no edema NEUROLOGIC:  Alert and oriented x 3. No focal neuro deficits noted. PSYCHIATRIC:  Normal affect   ASSESSMENT AND PLAN: .    Atypical chest/upper abdominal pain: -tender on palpation today and similar to prior pain. -low suspicion for cardiac etiology, but did discuss red flag warning signs that need immediate medical attention -she has been told in the past that this  pain was due to hiatal hernia and gallstones   Hypertension:  -continue furosemide 20 mg   Peripheral vascular disease Intermittent LE edema -continue aspirin, simvastatin -PVD followed by Dr. Darrick Penna -has obesity, BMI 38, as additional risk factor -feels compression stockings make symptoms worse   Cardiac risk counseling and prevention recommendations: -recommend heart healthy/Mediterranean diet, with whole grains, fruits, vegetable, fish, lean meats, nuts, and olive oil. Limit salt. -recommend moderate  walking, 3-5 times/week for 30-50 minutes each session. Aim for at least 150 minutes.week. Goal should be pace of 3 miles/hours, or walking 1.5 miles in 30 minutes -recommend avoidance of tobacco products. Avoid excess alcohol.  Dispo: Follow-up in 1 year, or sooner as needed.  I,Mathew Stumpf,acting as a Neurosurgeon for [employer], MD.,have documented all relevant documentation on the behalf of Jodelle Red, MD,as directed by  Jodelle Red, MD while in the presence of Jodelle Red, MD.  I, Jodelle Red, MD, have reviewed all documentation for this visit. The documentation on 01/03/23 for the exam, diagnosis, procedures, and orders are all accurate and complete.   Signed, Jodelle Red, MD

## 2023-01-03 NOTE — Patient Instructions (Signed)
Medication Instructions:  Continue current medications  *If you need a refill on your cardiac medications before your next appointment, please call your pharmacy*   Lab Work: None Ordered   Testing/Procedures: None Ordered   Follow-Up: At [facility], you and your health needs are our priority.  As part of our continuing mission to provide you with exceptional heart care, we have created designated Provider Care Teams.  These Care Teams include your primary Cardiologist (physician) and Advanced Practice Providers (APPs -  Physician Assistants and Nurse Practitioners) who all work together to provide you with the care you need, when you need it.  We recommend signing up for the patient portal called "MyChart".  Sign up information is provided on this After Visit Summary.  MyChart is used to connect with patients for Virtual Visits (Telemedicine).  Patients are able to view lab/test results, encounter notes, upcoming appointments, etc.  Non-urgent messages can be sent to your provider as well.   To learn more about what you can do with MyChart, go to [email or web address].    Your next appointment:   1 year(s)  Provider:   Bridgette Christopher, MD    Other Instructions   

## 2023-02-05 ENCOUNTER — Ambulatory Visit: Payer: Medicare Other

## 2023-04-28 ENCOUNTER — Emergency Department (HOSPITAL_COMMUNITY)
Admission: EM | Admit: 2023-04-28 | Discharge: 2023-04-29 | Disposition: A | Discharge status: Home / Self Care | Payer: Medicare Other | Attending: Emergency Medicine | Admitting: Emergency Medicine

## 2023-04-28 ENCOUNTER — Other Ambulatory Visit: Payer: Self-pay

## 2023-04-28 ENCOUNTER — Emergency Department (HOSPITAL_COMMUNITY): Payer: Medicare Other

## 2023-04-28 ENCOUNTER — Encounter (HOSPITAL_COMMUNITY): Payer: Self-pay | Admitting: Emergency Medicine

## 2023-04-28 DIAGNOSIS — I2510 Atherosclerotic heart disease of native coronary artery without angina pectoris: Secondary | ICD-10-CM | POA: Insufficient documentation

## 2023-04-28 DIAGNOSIS — Z7982 Long term (current) use of aspirin: Secondary | ICD-10-CM | POA: Insufficient documentation

## 2023-04-28 DIAGNOSIS — N183 Chronic kidney disease, stage 3 unspecified: Secondary | ICD-10-CM | POA: Diagnosis not present

## 2023-04-28 DIAGNOSIS — R0789 Other chest pain: Secondary | ICD-10-CM | POA: Insufficient documentation

## 2023-04-28 DIAGNOSIS — N289 Disorder of kidney and ureter, unspecified: Secondary | ICD-10-CM | POA: Diagnosis not present

## 2023-04-28 DIAGNOSIS — E1165 Type 2 diabetes mellitus with hyperglycemia: Secondary | ICD-10-CM | POA: Diagnosis not present

## 2023-04-28 DIAGNOSIS — E1122 Type 2 diabetes mellitus with diabetic chronic kidney disease: Secondary | ICD-10-CM | POA: Insufficient documentation

## 2023-04-28 DIAGNOSIS — I129 Hypertensive chronic kidney disease with stage 1 through stage 4 chronic kidney disease, or unspecified chronic kidney disease: Secondary | ICD-10-CM | POA: Insufficient documentation

## 2023-04-28 LAB — BASIC METABOLIC PANEL
Anion gap: 9 (ref 5–15)
BUN: 15 mg/dL (ref 8–23)
CO2: 27 mmol/L (ref 22–32)
Calcium: 9.3 mg/dL (ref 8.9–10.3)
Chloride: 102 mmol/L (ref 98–111)
Creatinine, Ser: 1.29 mg/dL — ABNORMAL HIGH (ref 0.44–1.00)
GFR, Estimated: 41 mL/min — ABNORMAL LOW (ref >60)
Glucose, Bld: 196 mg/dL — ABNORMAL HIGH (ref 70–99)
Potassium: 4.1 mmol/L (ref 3.5–5.1)
Sodium: 138 mmol/L (ref 135–145)

## 2023-04-28 LAB — CBC
HCT: 40 % (ref 36.0–46.0)
Hemoglobin: 13 g/dL (ref 12.0–15.0)
MCH: 30 pg (ref 26.0–34.0)
MCHC: 32.5 g/dL (ref 30.0–36.0)
MCV: 92.2 fL (ref 80.0–100.0)
Platelets: 240 10*3/uL (ref 150–400)
RBC: 4.34 MIL/uL (ref 3.87–5.11)
RDW: 14.6 % (ref 11.5–15.5)
WBC: 7.3 10*3/uL (ref 4.0–10.5)
nRBC: 0 % (ref 0.0–0.2)

## 2023-04-28 LAB — TROPONIN I (HIGH SENSITIVITY)
Troponin I (High Sensitivity): 11 ng/L (ref <18)
Troponin I (High Sensitivity): 11 ng/L (ref <18)

## 2023-04-28 NOTE — ED Triage Notes (Signed)
Pt in with constant L chest pressure x 4 days. Pt denies any dizziness, sob or n/v.

## 2023-04-29 NOTE — ED Provider Notes (Signed)
[facility]PARTMENT AT [facility] Provider Note  CSN: [id number] Arrival date & time: 04/28/23 1945  Chief Complaint(s) Chest Pain  12:54 AM HPI Linda Frye is a 82 y.o. female with a past medical history listed below including hypertension, CAD, diabetes, GERD and hiatal hernia who presents to the emergency department with recurring intermittent episodes of left-sided chest discomfort described as achiness.  These episodes only occur while she is sitting down at rest and lasts a few seconds and resolves spontaneously.  There are no alleviating or aggravating factors.  She reports that she never has the episodes when she is exerting herself.  She denies any associated shortness of breath, nausea, vomiting.  She does report intermittent epigastric abdominal discomfort not always associated with the chest discomfort.  Patient has had this for "a while," but has been occurring more frequently in the past 3 days.  States that she is now feeling it about 4-5 times per day. Last episode was just PTA. Has not had any since being here.  Reports she eats hot sauce with almost every meal. Has been eating more tomatoes recently as well. Eats fried food frequently.  The history is provided by the patient.    Past Medical History Past Medical History:  Diagnosis Date   Acute carpal tunnel syndrome    bilateral wrists   Arthritis    Coronary artery disease    Diabetes mellitus without complication (HCC)    Diverticulitis    Hypertension    Renal disorder    kidneys 35% function   Patient Active Problem List   Diagnosis Date Noted   Benign essential HTN 06/18/2019   Orthostatic hypotension 05/19/2019   Chronic kidney disease (CKD), stage III (moderate) (HCC) 05/18/2019   T2DM (type 2 diabetes mellitus) (HCC) 05/18/2019   Syncope 05/18/2019   UTI (urinary tract infection) 05/18/2019   Home Medication(s) Prior to Admission medications   Medication Sig Start  Date End Date Taking? Authorizing Provider  acetaminophen (TYLENOL) 500 MG tablet Take 500 mg by mouth daily as needed for headache.    [provider]  albuterol (VENTOLIN HFA) 108 (90 Base) MCG/ACT inhaler Inhale 2 puffs into the lungs every 6 (six) hours as needed. 01/01/23   [provider]  aspirin EC 81 MG tablet Take 81 mg by mouth daily.    [provider]  cholecalciferol (VITAMIN D3) 25 MCG (1000 UT) tablet Take 1,000 Units by mouth daily.    [provider]  famotidine (PEPCID) 40 MG tablet Take 40 mg by mouth at bedtime. 05/08/21   [provider]  furosemide (LASIX) 20 MG tablet Take 1 tablet (20 mg total) by mouth daily. 05/16/19   Jacalyn Lefevre, MD  nateglinide (STARLIX) 120 MG tablet Take 120 mg by mouth 3 (three) times daily with meals.    [provider]  nitroGLYCERIN (NITROSTAT) 0.4 MG SL tablet Place 1 tablet under the tongue every 5 (five) minutes as needed for chest pain. 07/24/19   [provider]  sertraline (ZOLOFT) 50 MG tablet Take 50 mg by mouth daily. 01/03/21   [provider]  simvastatin (ZOCOR) 40 MG tablet Take 40 mg by mouth daily.    [provider]  vitamin B-12 (CYANOCOBALAMIN) 500 MCG tablet Take 500 mcg by mouth daily.    [provider]  Allergies Epinephrine  Review of Systems Review of Systems As noted in HPI  Physical Exam Vital Signs  I have reviewed the triage vital signs BP (!) 168/83   Pulse (!) 53   Temp 98.1 F (36.7 C)   Resp 15   Wt 94.3 kg   SpO2 100%   BMI 38.02 kg/m   Physical Exam Vitals reviewed.  Constitutional:      General: She is not in acute distress.    Appearance: She is well-developed. She is not diaphoretic.  HENT:     Head: Normocephalic and atraumatic.     Nose: Nose normal.  Eyes:      General: No scleral icterus.       Right eye: No discharge.        Left eye: No discharge.     Conjunctiva/sclera: Conjunctivae normal.     Pupils: Pupils are equal, round, and reactive to light.  Cardiovascular:     Rate and Rhythm: Normal rate and regular rhythm.     Heart sounds: No murmur heard.    No friction rub. No gallop.  Pulmonary:     Effort: Pulmonary effort is normal. No respiratory distress.     Breath sounds: Normal breath sounds. No stridor. No rales.  Abdominal:     General: There is no distension.     Palpations: Abdomen is soft.     Tenderness: There is no abdominal tenderness.  Musculoskeletal:        General: No tenderness.     Cervical back: Normal range of motion and neck supple.  Skin:    General: Skin is warm and dry.     Findings: No erythema or rash.  Neurological:     Mental Status: She is alert and oriented to person, place, and time.     ED Results and Treatments Labs (all labs ordered are listed, but only abnormal results are displayed) Labs Reviewed  BASIC METABOLIC PANEL - Abnormal; Notable for the following components:      Result Value   Glucose, Bld 196 (*)    Creatinine, Ser 1.29 (*)    GFR, Estimated 41 (*)    All other components within normal limits  CBC  TROPONIN I (HIGH SENSITIVITY)  TROPONIN I (HIGH SENSITIVITY)                                                                                                                         EKG  EKG Interpretation Date/Time:  Sunday April 28 2023 19:56:35 EDT Ventricular Rate:  55 PR Interval:  92 QRS Duration:  64 QT Interval:  436 QTC Calculation: 417 R Axis:   -6  Text Interpretation: Ectopic atrial rhythm Abnormal ECG When compared with ECG of 29-Sep-2022 17:35, PREVIOUS ECG IS PRESENT No significant change since last tracing Confirmed by Drema Pry [phone]) on 04/29/2023 12:25:05 AM       Radiology DG Chest 2 View  Result Date: 04/28/2023 CLINICAL DATA:  Three day  history of chest pain. EXAM: CHEST - 2 VIEW COMPARISON:  Chest x-ray 09/29/2018 for FINDINGS: The heart is within normal limits in size and stable. The mediastinal and hilar contours are stable. Stable aortic calcifications. Chronic bronchitic type interstitial changes and mild eventration of the right hemidiaphragm. No acute superimposed pulmonary infiltrate or pleural effusions. IMPRESSION: Chronic lung changes but no acute overlying pulmonary process. Electronically Signed   By: Rudie Meyer M.D.   On: 04/28/2023 21:20    Medications Ordered in ED Medications - No data to display Procedures Procedures  (including critical care time) Medical Decision Making / ED Course   Medical Decision Making Amount and/or Complexity of Data Reviewed Labs: ordered. Radiology: ordered.    Highly atypical chest pain inconsistent with ACS.  EKG without acute ischemic changes or evidence of pericarditis.  Serial troponins negative x 2.  Unlikely ACS.  Presentation not classic for dissection or esophageal perforation.  Low concern for PE.  CBC without leukocytosis or anemia.  Metabolic panel without significant electrolyte derangement.  Patient has hyperglycemia without DKA.  Mild renal insufficiency without AKI.  Chest x-ray without evidence of pneumonia, pneumothorax, pulmonary edema pleural effusions.  Likely GI related.    Final Clinical Impression(s) / ED Diagnoses Final diagnoses:  Atypical chest pain   The patient appears reasonably screened and/or stabilized for discharge and I doubt any other medical condition or other [facility] requiring further screening, evaluation, or treatment in the ED at this time. I have discussed the findings, Dx and Tx plan with the patient/family who expressed understanding and agree(s) with the plan. Discharge instructions discussed at length. The patient/family was given strict return precautions who verbalized understanding of the instructions. No further questions at  time of discharge.  Disposition: Discharge  Condition: Good  ED Discharge Orders     None       Follow Up: Fleet Contras, MD [address] [place] [address] [phone]  Call  to schedule an appointment for close follow up    This chart was dictated using voice recognition software.  Despite best efforts to proofread,  errors can occur which can change the documentation meaning.    Nira Conn, MD 04/29/23 [phone]

## 2023-07-04 ENCOUNTER — Encounter: Payer: Self-pay | Admitting: Gastroenterology

## 2023-07-04 ENCOUNTER — Ambulatory Visit: Payer: Medicare Other | Admitting: Gastroenterology

## 2023-07-04 VITALS — BP 134/62 | HR 60 | Ht 62.0 in | Wt 198.2 lb

## 2023-07-04 DIAGNOSIS — R1319 Other dysphagia: Secondary | ICD-10-CM

## 2023-07-04 DIAGNOSIS — K641 Second degree hemorrhoids: Secondary | ICD-10-CM | POA: Diagnosis not present

## 2023-07-04 DIAGNOSIS — R151 Fecal smearing: Secondary | ICD-10-CM

## 2023-07-04 DIAGNOSIS — R1310 Dysphagia, unspecified: Secondary | ICD-10-CM

## 2023-07-04 DIAGNOSIS — K625 Hemorrhage of anus and rectum: Secondary | ICD-10-CM

## 2023-07-04 DIAGNOSIS — R194 Change in bowel habit: Secondary | ICD-10-CM | POA: Diagnosis not present

## 2023-07-04 DIAGNOSIS — R0789 Other chest pain: Secondary | ICD-10-CM | POA: Diagnosis not present

## 2023-07-04 DIAGNOSIS — K219 Gastro-esophageal reflux disease without esophagitis: Secondary | ICD-10-CM

## 2023-07-04 DIAGNOSIS — K648 Other hemorrhoids: Secondary | ICD-10-CM

## 2023-07-04 DIAGNOSIS — K8020 Calculus of gallbladder without cholecystitis without obstruction: Secondary | ICD-10-CM

## 2023-07-04 MED ORDER — FAMOTIDINE 20 MG PO TABS: 20.0000 mg | ORAL_TABLET | Freq: Two times a day (BID) | ORAL | 5 refills | Status: DC

## 2023-07-04 NOTE — Progress Notes (Addendum)
Chief Complaint: rectal bleeding Primary GI Doctor: Dr. Leone Payor  HPI: Patient is 82 year old female patient who presents as a new patient with past medical history of hypertension, CAD,diverticulosis, diabetes, GERD and hiatal hernia who was referred to me on 05/27/2023  by Fleet Contras, MD for diverticulosis.  09/29/2022 patient seen in ED for epigastric abdominal pain.  CBC normal.  Trop negative x 2.  lipase normal.  CT abdomen pelvis with cholelithiasis with no evidence of acute cholecystitis and colonic diverticulosis without acute diverticulitis.  01/03/2023 patient was seen by cardiologist Dr. Janne Napoleon.  Patient expressed concerns about chest pain.  Low suspicion for cardiac etiology at that time.   04/28/2023 patient seen in ED for atypical chest pain.  EKG without acute ischemic changes or evidence of pericarditis.  Troponins negative x 2.  CBC without leukocytosis or anemia.  Mild renal insufficiency without acute kidney injury.  Chest x-ray without evidence of pneumonia or edema.  They suspected it was most likely GI related.  Interval History   Patient presents with main complaint of altered bowel habits with rectal bleeding.  She reports her symptoms started about a year ago. Patient reports every time she goes to urinate she will also pass a small amount of stool with mucus.  She reports she will also have intermittent bright red blood with wiping.  She does strain at times.  She also reports she has had some leakage and wears a depends.  She will have some intermediate upper abdominal discomfort that resolves on own. No known food triggers.   Patient is also taking famotidine 40 mg as needed.  Reports she does not have any GERD symptoms but has had intermittent episodes of chest discomfort that brought her to ED.  Cardiac workup was completed and it was thought to be GI related.  Patient does admit she has not been taking daily as prescribed.  She reports she has not had a  episode of chest discomfort since October.  She has followed up with her cardiologist since then and discussed with them.    She has intermittent issues with esophageal dysphagia with solids and liquids which she said has been evaluated in the past in [place] with swallow study and endoscopy. Patient states she was told it was normal.    Last EGD/colon about 5 years ago or more in [place]. Per patient she had Hiatal hernia, 5 colonic poylps, and internal hemorrhoids.  No alcohol or tobacco use.  Family history includes cancer in his sister and uterine cancer and another sister.  Past Medical History:  Diagnosis Date   Acute carpal tunnel syndrome    bilateral wrists   Anxiety and depression    Arthritis    Chronic kidney disease    Coronary artery disease    Diabetes mellitus without complication (HCC)    Diverticulitis    Diverticulosis    GERD (gastroesophageal reflux disease)    Hiatal hernia    History of colon polyps    Hypertension    Renal disorder    kidneys 35% function   Uterine cancer [facility])    Past Surgical History:  Procedure Laterality Date   ABDOMINAL HYSTERECTOMY     COLONOSCOPY  2018   [place] daughter [place] said they removed 5-6 colon popys   ESOPHAGOGASTRODUODENOSCOPY  2018   [place] daughter [place]   TUBAL LIGATION     Current Outpatient Medications  Medication Sig Dispense Refill   acetaminophen (TYLENOL) 500 MG  tablet Take 500 mg by mouth daily as needed for headache.     albuterol (VENTOLIN HFA) 108 (90 Base) MCG/ACT inhaler Inhale 2 puffs into the lungs every 6 (six) hours as needed.     aspirin EC 81 MG tablet Take 81 mg by mouth daily.     cholecalciferol (VITAMIN D3) 25 MCG (1000 UT) tablet Take 1,000 Units by mouth daily.     famotidine (PEPCID) 20 MG tablet Take 1 tablet (20 mg total) by mouth 2 (two) times daily before a meal. 60 tablet 5   furosemide (LASIX) 20 MG tablet Take 1 tablet (20 mg total) by mouth daily. 30 tablet 0    nateglinide (STARLIX) 120 MG tablet Take 120 mg by mouth 3 (three) times daily with meals.     nitroGLYCERIN (NITROSTAT) 0.4 MG SL tablet Place 1 tablet under the tongue every 5 (five) minutes as needed for chest pain.     sertraline (ZOLOFT) 50 MG tablet Take 50 mg by mouth daily.     simvastatin (ZOCOR) 40 MG tablet Take 40 mg by mouth daily.     vitamin B-12 (CYANOCOBALAMIN) 500 MCG tablet Take 500 mcg by mouth daily.     No current facility-administered medications for this visit.   Allergies as of 07/04/2023 - Review Complete 07/04/2023  Allergen Reaction Noted   Epinephrine Other (See Comments) and Palpitations 03/28/2011   Family History  Problem Relation Age of Onset   Heart disease Mother    Breast cancer Sister    Uterine cancer Sister    Lung cancer Sister    Colon cancer Neg Hx    Esophageal cancer Neg Hx    Review of Systems:    Constitutional: No weight loss, fever, chills, weakness or fatigue HEENT: Eyes: No change in vision               Ears, Nose, Throat:  No change in hearing or congestion Skin: No rash or itching Cardiovascular: No chest pain, chest pressure or palpitations   Respiratory: No SOB or cough Gastrointestinal: See HPI and otherwise negative Genitourinary: No dysuria or change in urinary frequency Neurological: No headache, dizziness or syncope Musculoskeletal: No new muscle or joint pain Hematologic: No bleeding or bruising Psychiatric: No history of depression or anxiety   Physical Exam:  Vital signs: BP 134/62   Pulse 60   Ht 5\' 2"  (1.575 m)   Wt 198 lb 4 oz (89.9 kg)   BMI 36.26 kg/m   Constitutional:   Pleasant  female appears to be in NAD, Well developed, Well nourished, alert and cooperative Neck:  Supple Throat: Oral cavity and pharynx without inflammation, swelling or lesion.  Respiratory: Respirations even and unlabored. Lungs clear to auscultation bilaterally.   No wheezes, crackles, or rhonchi.  Cardiovascular: Normal S1, S2.  Regular rate and rhythm. No peripheral edema, cyanosis or pallor.  Gastrointestinal:  Soft, nondistended, nontender. No rebound or guarding. Normal bowel sounds. No appreciable masses or hepatomegaly. Rectal:Normal external rectal exam, normal rectal tone, Anoscopy performed:  appreciated internal grade 2 hemorrhoids, non-tender, no masses, , brown stool, hemoccult N/A  Msk:  Symmetrical without gross deformities. Without edema, no deformity or joint abnormality.  Neurologic:  Alert and  oriented x4;  grossly normal neurologically.  Skin:   Dry and intact without significant lesions or rashes. Psychiatric: Oriented to person, place and time. Demonstrates good judgement and reason without abnormal affect or behaviors.  RELEVANT LABS AND IMAGING: CBC    Latest Ref  Rng & Units 04/28/2023    8:17 PM 09/29/2022    6:55 PM 09/13/2022   12:00 AM  CBC  WBC 4.0 - 10.5 K/uL 7.3  7.5  CANCELED   Hemoglobin 12.0 - 15.0 g/dL 02.7  25.3    Hematocrit 36.0 - 46.0 % 40.0  40.0    Platelets 150 - 400 K/uL 240  243       CMP     Latest Ref Rng & Units 04/28/2023    8:17 PM 09/29/2022    6:55 PM 09/13/2022   12:00 AM  CMP  Glucose 70 - 99 mg/dL 664  403  474   BUN 8 - 23 mg/dL 15  14  14    Creatinine 0.44 - 1.00 mg/dL 2.59  5.63  8.75   Sodium 135 - 145 mmol/L 138  135  141   Potassium 3.5 - 5.1 mmol/L 4.1  3.8  4.8   Chloride 98 - 111 mmol/L 102  101  105   CO2 22 - 32 mmol/L 27  26  19    Calcium 8.9 - 10.3 mg/dL 9.3  8.7  9.0   Total Protein 6.5 - 8.1 g/dL  7.4  7.6   Total Bilirubin 0.3 - 1.2 mg/dL  0.9  0.6   Alkaline Phos 38 - 126 U/L  72    AST 15 - 41 U/L  18  19   ALT 0 - 44 U/L  9  6     08/12/2019 echo- Left ventricular ejection fraction, by visual estimation, is 65 to 70%.   09/29/2022 CT abdomen/pelvis IMPRESSION: 1. Cholelithiasis with no CT evidence of acute cholecystitis. 2. Colonic diverticulosis with no acute diverticulitis. 3. Mild pulmonary edema. 4.  Aortic Atherosclerosis  (ICD10-I70.0).  Encounter Diagnoses  Name Primary?   Altered bowel habits Yes   Internal hemorrhoids    Gastroesophageal reflux disease, unspecified whether esophagitis present    Esophageal dysphagia    Calculus of gallbladder without cholecystitis without obstruction    Fecal smearing     Assessment:  82 year old female patient presents with main complaint of altered bowel habits with rectal bleeding.  We discussed increasing overall fiber intake and adding Citrucel once daily to see if this helps with the stool leakage.  Upon physical examination patient did exhibit grade 2 internal hemorrhoids which I suspect is where she is seeing bright red blood with wiping.  We will give her topical ointment Recticare to apply twice daily for 1 week. She will contact the office for refills.  Also discussed other options such as hemorrhoid banding which she would like to hold off for now.    For the noncardiac chest pain/discomfort we discussed the importance of taking the antiacid medication as prescribed.  I will have the patient take famotidine 20 mg twice daily before breakfast and dinner along with following a strict GERD diet.  This [place] also help with the esophageal dysphagia.  We discussed doing a swallow study or endoscopy to further evaluate which patient declines at this time.  Patient would like to avoid any invasive procedures.  Plan: -Recommend patient take famotidine 20 mg twice daily before breakfast and dinner. - Reinforced GERD diet -dietary recommendations for dysphagia handed out -Recommend EGD or upper GI series- patient declines -Start Citrucel once daily -Recticare cream apply twice daily for 7 days, refill if helps symptoms -Educated on high fiber diet -Follow up 2 to 3 months with APP or Dr. Jonna Munro Ava Tangney, FNP-C  [facility] 07/04/2023, 4:53 PM  Cc: Fleet Contras, MD

## 2023-07-04 NOTE — Patient Instructions (Addendum)
We have given you samples of the following medication to take: Recticare- insert per rectum twice daily, for 7 days.   Please start Citrucel once daily.   We have you scheduled for a follow up on 08/26/2023 at 9:00am with [facility].   High-Fiber Eating Plan Fiber, also called dietary fiber, is found in foods such as fruits, vegetables, whole grains, and beans. A high-fiber diet can be good for your health. Your health care provider may recommend a high-fiber diet to help: Prevent trouble pooping (constipation). Lower your cholesterol. Treat the following conditions: Hemorrhoids. This is inflammation of veins in the anus. Inflammation of specific areas of the digestive tract. Irritable bowel syndrome (IBS). This is a problem of the large intestine, also called the colon, that sometimes causes belly pain and bloating. Prevent overeating as part of a weight-loss plan. Lower the risk of heart disease, type 2 diabetes, and certain cancers. What are tips for following this plan? Reading food labels  Check the nutrition facts label on foods for the amount of dietary fiber. Choose foods that have 4 grams of fiber or more per serving. The recommended goals for how much fiber you should eat each day include: Males 41 years old or younger: 30-34 g. Males over 48 years old: 28-34 g. Females 81 years old or younger: 25-28 g. Females over 89 years old: 22-25 g. Your daily fiber goal is _____________ g. Shopping Choose whole fruits and vegetables instead of processed. For example, choose apples instead of apple juice or applesauce. Choose a variety of high-fiber foods such as avocados, lentils, oats, and pinto beans. Read the nutrition facts label on foods. Check for foods with added fiber. These foods often have high sugar and salt (sodium) amounts per serving. Cooking Use whole-grain flour for baking and cooking. Cook with brown rice instead of white rice. Make meals that have a lot of beans and  vegetables in them, such as chili or vegetable-based soups. Meal planning Start the day with a breakfast that is high in fiber, such as a cereal that has 5 g of fiber or more per serving. Eat breads and cereals that are made with whole-grain flour instead of refined flour or white flour. Eat brown rice, bulgur wheat, or millet instead of white rice. Use beans in place of meat in soups, salads, and pasta dishes. Be sure that half of the grains you eat each day are whole grains. General information You can get the recommended amount of dietary fiber by: Eating a variety of fruits, vegetables, grains, nuts, and beans. Taking a fiber supplement if you aren't able to eat enough fiber. It's better to get fiber through food than from a supplement. Slowly increase how much fiber you eat. If you increase the amount of fiber you eat too quickly, you may have bloating, cramping, or gas. Drink plenty of water to help you digest fiber. Choose high-fiber snacks, such as berries, raw vegetables, nuts, and popcorn. What foods should I eat? Fruits Berries. Pears. Apples. Oranges. Avocado. Prunes and raisins. Dried figs. Vegetables Sweet potatoes. Spinach. Kale. Artichokes. Cabbage. Broccoli. Cauliflower. Green peas. Carrots. Squash. Grains Whole-grain breads. Multigrain cereal. Oats and oatmeal. Brown rice. Barley. Bulgur wheat. Millet. Quinoa. Bran muffins. Popcorn. Rye wafer crackers. Meats and other proteins Navy beans, kidney beans, and pinto beans. Soybeans. Split peas. Lentils. Nuts and seeds. Dairy Fiber-fortified yogurt. Fortified means that fiber has been added to the product. Beverages Fiber-fortified soy milk. Fiber-fortified orange juice. Other foods Fiber bars. The items  listed above may not be all the foods and drinks you can have. Talk to a dietitian to learn more. What foods should I avoid? Fruits Fruit juice. Cooked, strained fruit. Vegetables Fried potatoes. Canned vegetables.  Well-cooked vegetables. Grains White bread. Pasta made with refined flour. White rice. Meats and other proteins Fatty meat. Fried chicken or fried fish. Dairy Milk. Cream cheese. Sour cream. Fats and oils Butters. Beverages Soft drinks. Other foods Cakes and pastries. The items listed above may not be all the foods and drinks you should avoid. Talk to a dietitian to learn more. This information is not intended to replace advice given to you by your health care provider. Make sure you discuss any questions you have with your health care provider. Document Revised: 09/17/2022 Document Reviewed: 09/17/2022 Elsevier Patient Education  2024 Elsevier Inc.  ______________________________________________________   If your blood pressure at your visit was 140/90 or greater, please contact your primary care physician to follow up on this.  _______________________________________________________  If you are age 29 or older, your body mass index should be between 23-30. Your Body mass index is 36.26 kg/m. If this is out of the aforementioned range listed, please consider follow up with your Primary Care Provider.  If you are age 16 or younger, your body mass index should be between 19-25. Your Body mass index is 36.26 kg/m. If this is out of the aformentioned range listed, please consider follow up with your Primary Care Provider.   ________________________________________________________  The [facility] providers would like to encourage you to use [facility] to communicate with providers for non-urgent requests or questions.  Due to long hold times on the telephone, sending your provider a message by [facility] may be a faster and more efficient way to get a response.  Please allow 48 business hours for a response.  Please remember that this is for non-urgent requests.  _______________________________________________________ It was a pleasure to see you today!  Thank you for trusting me with  your gastrointestinal care!

## 2023-08-26 ENCOUNTER — Ambulatory Visit (INDEPENDENT_AMBULATORY_CARE_PROVIDER_SITE_OTHER): Payer: Medicare Other | Admitting: Gastroenterology

## 2023-08-26 ENCOUNTER — Telehealth: Payer: Self-pay

## 2023-08-26 ENCOUNTER — Other Ambulatory Visit (INDEPENDENT_AMBULATORY_CARE_PROVIDER_SITE_OTHER): Payer: Medicare Other

## 2023-08-26 ENCOUNTER — Encounter: Payer: Self-pay | Admitting: Gastroenterology

## 2023-08-26 VITALS — BP 112/72 | HR 61 | Ht 62.0 in | Wt 201.5 lb

## 2023-08-26 DIAGNOSIS — R1031 Right lower quadrant pain: Secondary | ICD-10-CM

## 2023-08-26 DIAGNOSIS — K5900 Constipation, unspecified: Secondary | ICD-10-CM

## 2023-08-26 DIAGNOSIS — K219 Gastro-esophageal reflux disease without esophagitis: Secondary | ICD-10-CM

## 2023-08-26 DIAGNOSIS — G8929 Other chronic pain: Secondary | ICD-10-CM

## 2023-08-26 DIAGNOSIS — K625 Hemorrhage of anus and rectum: Secondary | ICD-10-CM

## 2023-08-26 DIAGNOSIS — K648 Other hemorrhoids: Secondary | ICD-10-CM

## 2023-08-26 DIAGNOSIS — K5909 Other constipation: Secondary | ICD-10-CM

## 2023-08-26 LAB — COMPREHENSIVE METABOLIC PANEL
ALT: 5 U/L (ref 0–35)
AST: 15 U/L (ref 0–37)
Albumin: 3.4 g/dL — ABNORMAL LOW (ref 3.5–5.2)
Alkaline Phosphatase: 76 U/L (ref 39–117)
BUN: 13 mg/dL (ref 6–23)
CO2: 29 meq/L (ref 19–32)
Calcium: 9.2 mg/dL (ref 8.4–10.5)
Chloride: 103 meq/L (ref 96–112)
Creatinine, Ser: 1.08 mg/dL (ref 0.40–1.20)
GFR: 47.49 mL/min — ABNORMAL LOW (ref >60.00)
Glucose, Bld: 129 mg/dL — ABNORMAL HIGH (ref 70–99)
Potassium: 4.4 meq/L (ref 3.5–5.1)
Sodium: 140 meq/L (ref 135–145)
Total Bilirubin: 0.5 mg/dL (ref 0.2–1.2)
Total Protein: 7.7 g/dL (ref 6.0–8.3)

## 2023-08-26 LAB — CBC WITH DIFFERENTIAL/PLATELET
Basophils Absolute: 0 10*3/uL (ref 0.0–0.1)
Basophils Relative: 0.3 % (ref 0.0–3.0)
Eosinophils Absolute: 0.2 10*3/uL (ref 0.0–0.7)
Eosinophils Relative: 3.3 % (ref 0.0–5.0)
HCT: 38.9 % (ref 36.0–46.0)
Hemoglobin: 12.8 g/dL (ref 12.0–15.0)
Lymphocytes Relative: 28.5 % (ref 12.0–46.0)
Lymphs Abs: 1.8 10*3/uL (ref 0.7–4.0)
MCHC: 32.9 g/dL (ref 30.0–36.0)
MCV: 92.8 fL (ref 78.0–100.0)
Monocytes Absolute: 0.8 10*3/uL (ref 0.1–1.0)
Monocytes Relative: 12.7 % — ABNORMAL HIGH (ref 3.0–12.0)
Neutro Abs: 3.5 10*3/uL (ref 1.4–7.7)
Neutrophils Relative %: 55.2 % (ref 43.0–77.0)
Platelets: 236 10*3/uL (ref 150.0–400.0)
RBC: 4.19 Mil/uL (ref 3.87–5.11)
RDW: 15 % (ref 11.5–15.5)
WBC: 6.4 10*3/uL (ref 4.0–10.5)

## 2023-08-26 LAB — TSH: TSH: 2.96 u[IU]/mL (ref 0.35–5.50)

## 2023-08-26 MED ORDER — HYDROCORTISONE ACETATE 25 MG RE SUPP: 25.0000 mg | Freq: Two times a day (BID) | RECTAL | 1 refills | Status: AC

## 2023-08-26 MED ORDER — FAMOTIDINE 40 MG PO TABS: 40.0000 mg | ORAL_TABLET | Freq: Every day | ORAL | 2 refills | Status: AC

## 2023-08-26 NOTE — Telephone Encounter (Signed)
-----   Message from Legrand Como sent at 08/26/2023  1:19 PM EST ----- Please let the patient know that her lab results have come back.  Her kidney function is stable.  No evidence of anemia or infection.  Her thyroid is normal.  We will await her CT scan

## 2023-08-26 NOTE — Progress Notes (Signed)
 Lmtcb. (Re: lab results)

## 2023-08-26 NOTE — Patient Instructions (Addendum)
 Your provider has requested that you go to the basement level for lab work before leaving today. Press "B" on the elevator. The lab is located at the first door on the left as you exit the elevator.  We have sent the following medications to your pharmacy for you to pick up at your convenience: Famotidine 40 mg at night  Hydrocortisone Suppository 25 mg twice a day for 14 days.  Start taking Miralax 1 capful (17 grams) 1x / day for 1 week.   If this is not effective, increase to 1 dose 2x / day for 1 week.   If this is still not effective, increase to two capfuls (34 grams) 2x / day.   Can adjust dose as needed based on response. Can take 1/2 cap daily, skip days, or increase per day.    You have been scheduled for a CT scan of the abdomen and pelvis at [facility], 1st floor Radiology. You are scheduled on Friday 08/30/2023 at 10 am. You should arrive 15 minutes prior to your appointment time for registration.   Please follow the written instructions below on the day of your exam:   1) Do not eat anything after 8 am (4 hours prior to your test)    You may take any medications as prescribed with a small amount of water, if necessary. If you take any of the following medications: METFORMIN, GLUCOPHAGE, GLUCOVANCE, AVANDAMET, RIOMET, FORTAMET, ACTOPLUS MET, JANUMET, GLUMETZA or METAGLIP, you MAY be asked to HOLD this medication 48 hours AFTER the exam.   The purpose of you drinking the oral contrast is to aid in the visualization of your intestinal tract. The contrast solution may cause some diarrhea. Depending on your individual set of symptoms, you may also receive an intravenous injection of x-ray contrast/dye. Plan on being at [facility] for 45 minutes or longer, depending on the type of exam you are having performed.   If you have any questions regarding your exam or if you need to reschedule, you may call [facility] at [phone] between the hours of 8:00 am and 5:00  pm, Monday-Friday.      VISIT SUMMARY:  Today, we discussed your worsening rectal bleeding, constipation, right lower abdominal pain, and persistent epigastric pain. We have made some adjustments to your treatment plan to help manage these issues more effectively.  YOUR PLAN:  -RECTAL BLEEDING: Your rectal bleeding is due to internal hemorrhoids, which are swollen blood vessels in the rectum. We will start you on suppositories to help reduce the swelling and bleeding.  -CHRONIC CONSTIPATION: Your chronic constipation has shown some improvement with Citrucel, but you are still experiencing incomplete bowel movements. We will continue Citrucel and add MiraLAX, which is a laxative that helps to soften stools and make bowel movements easier.  -RIGHT LOWER QUADRANT PAIN: You have constant pain in your right lower abdomen that radiates to your back. To investigate the cause of this pain, we will order a CT scan to get a detailed look at your abdominal and pelvic organs.  -GASTROESOPHAGEAL REFLUX DISEASE (GERD): Your GERD is causing persistent epigastric pain despite taking Famotidine 20mg . We will increase your dose to 40mg  daily to help manage your symptoms more effectively.  -GENERAL HEALTH MAINTENANCE: We will order routine lab tests today to monitor your overall health and ensure that your current treatments are appropriate.  INSTRUCTIONS:  Please follow up with the CT scan as soon as it is scheduled. Continue taking your medications  as prescribed and start the new treatments as discussed. We will review your lab results at your next appointment.

## 2023-08-26 NOTE — Progress Notes (Signed)
 Chief Complaint: Follow-up of altered bowel habits Primary GI MD: Dr. Leone Payor  HPI: Patient is 83 year old female patient who presents as a new patient with past medical history of hypertension, CAD,diverticulosis, diabetes, GERD and hiatal hernia who was referred to me on 05/27/2023  by Fleet Contras, MD for diverticulosis.   09/29/2022 patient seen in ED for epigastric abdominal pain.  CBC normal.  Trop negative x 2.  lipase normal.  CT abdomen pelvis with cholelithiasis with no evidence of acute cholecystitis and colonic diverticulosis without acute diverticulitis.   01/03/2023 patient was seen by cardiologist Dr. Janne Napoleon.  Patient expressed concerns about chest pain.  Low suspicion for cardiac etiology at that time.    04/28/2023 patient seen in ED for atypical chest pain.  EKG without acute ischemic changes or evidence of pericarditis.  Troponins negative x 2.  CBC without leukocytosis or anemia.  Mild renal insufficiency without acute kidney injury.  Chest x-ray without evidence of pneumonia or edema.  They suspected it was most likely GI related  07/04/2023: seen by [facility], NP.  She was having altered bowel habits and rectal bleeding and grade 2 internal hemorrhoids were noted on physical exam.  Patient was recommended to add Citrucel and was given RectiCare twice daily.  She was also having noncardiac chest pain in which she was given famotidine 20 Mg twice daily.  Patient declined EGD  ----------------TODAY-------------------  She experiences worsening rectal bleeding with every bowel movement, noting an increase in severity. She has been using a cream for treatment without relief. No rectal pain is reported.  She has a history of constipation, which has shown some improvement with Citrucel, yet she continues to have frequent small bowel movements, often coinciding with urination. Citrucel has not been effective in fully managing this issue. She feels she does not have a  full bowel movement.  She reports constant right lower abdominal pain that radiates to her back, unrelated to bowel movements. This pain is persistent and does not improve with current treatment.  She is concerned about cancer.  She experiences epigastric pain associated with her gastroesophageal reflux disease. 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       PREVIOUS GI WORKUP    Last EGD/colon about 5 years ago or more in [place]. Per patient she had Hiatal hernia, 5 colonic poylps, and internal hemorrhoids.  No alcohol or tobacco use.  Family history includes cancer in his sister and uterine cancer and another sister.  Past Medical History:  Diagnosis Date   Acute carpal tunnel syndrome    bilateral wrists   Anxiety and depression    Arthritis    Chronic kidney disease    Coronary artery disease    Diabetes mellitus without complication (HCC)    Diverticulitis    Diverticulosis    GERD (gastroesophageal reflux disease)    Hiatal hernia    History of colon polyps    Hypertension    Renal disorder    kidneys 35% function   Uterine cancer [facility])     Past Surgical History:  Procedure Laterality Date   ABDOMINAL HYSTERECTOMY     COLONOSCOPY  2018   kings daughter Virgina [place] said they removed 5-6 colon popys   ESOPHAGOGASTRODUODENOSCOPY  2018   [place]ings daughter [place]   TUBAL LIGATION  Current Outpatient Medications  Medication Sig Dispense Refill   acetaminophen (TYLENOL) 500 MG tablet Take 500 mg by mouth daily as needed for headache.     albuterol (VENTOLIN HFA) 108 (90 Base) MCG/ACT inhaler Inhale 2 puffs into the lungs every 6 (six) hours as needed.     aspirin EC 81 MG tablet Take 81 mg by mouth daily.     cholecalciferol (VITAMIN D3) 25 MCG (1000 UT) tablet Take 1,000 Units by mouth daily.      famotidine (PEPCID) 20 MG tablet Take 1 tablet (20 mg total) by mouth 2 (two) times daily before a meal. 60 tablet 5   furosemide (LASIX) 20 MG tablet Take 1 tablet (20 mg total) by mouth daily. 30 tablet 0   nateglinide (STARLIX) 120 MG tablet Take 120 mg by mouth 3 (three) times daily with meals.     nitroGLYCERIN (NITROSTAT) 0.4 MG SL tablet Place 1 tablet under the tongue every 5 (five) minutes as needed for chest pain.     sertraline (ZOLOFT) 50 MG tablet Take 50 mg by mouth daily.     simvastatin (ZOCOR) 40 MG tablet Take 40 mg by mouth daily.     vitamin B-12 (CYANOCOBALAMIN) 500 MCG tablet Take 500 mcg by mouth daily.     No current facility-administered medications for this visit.    Allergies as of 08/26/2023 - Review Complete 08/26/2023  Allergen Reaction Noted   Epinephrine Other (See Comments) and Palpitations 03/28/2011    Family History  Problem Relation Age of Onset   Heart disease Mother    Breast cancer Sister    Uterine cancer Sister    Lung cancer Sister    Colon cancer Neg Hx    Esophageal cancer Neg Hx     Social History   Socioeconomic History   Marital status: Widowed    Spouse name: Not on file   Number of children: Not on file   Years of education: Not on file   Highest education level: Not on file  Occupational History   Not on file  Tobacco Use   Smoking status: Never   Smokeless tobacco: Never  Vaping Use   Vaping status: Never Used  Substance and Sexual Activity   Alcohol use: Not Currently   Drug use: Never   Sexual activity: Not Currently  Other Topics Concern   Not on file  Social History Narrative   Not on file   Social Drivers of Health   Financial Resource Strain: Not on file  Food Insecurity: Not on file  Transportation Needs: Not on file  Physical Activity: Not on file  Stress: Not on file  Social Connections: Unknown (11/21/2021)   Received from [facility], [facility]   Social Network    Social Network: Not on  file  Intimate Partner Violence: Unknown (10/13/2021)   Received from [employer], [facility]   HITS    Physically Hurt: Not on file    Insult or Talk Down To: Not on file    Threaten Physical Harm: Not on file    Scream or Curse: Not on file    Review of Systems:    Constitutional: No weight loss, fever, chills, weakness or fatigue HEENT: Eyes: No change in vision               Ears, Nose, Throat:  No change in hearing or congestion Skin: No rash or itching Cardiovascular: No chest pain, chest pressure or palpitations   Respiratory: No  SOB or cough Gastrointestinal: See HPI and otherwise negative Genitourinary: No dysuria or change in urinary frequency Neurological: No headache, dizziness or syncope Musculoskeletal: No new muscle or joint pain Hematologic: No bleeding or bruising Psychiatric: No history of depression or anxiety    Physical Exam:  Vital signs: BP 112/72   Pulse 61   Ht 5\' 2"  (1.575 m)   Wt 201 lb 8 oz (91.4 kg)   BMI 36.85 kg/m   Constitutional: NAD, Well developed, Well nourished, alert and cooperative Head:  Normocephalic and atraumatic. Eyes:   PEERL, EOMI. No icterus. Conjunctiva pink. Respiratory: Respirations even and unlabored. Lungs clear to auscultation bilaterally.   No wheezes, crackles, or rhonchi.  Cardiovascular:  Regular rate and rhythm. No peripheral edema, cyanosis or pallor.  Gastrointestinal:  Soft, nondistended, nontender. No rebound or guarding. Normal bowel sounds. No appreciable masses or hepatomegaly. Rectal:  Demita CMA chaperone. Decreased rectal tone, no external hemorrhoids or fissures, internal hemorrrhoids on DRE  ANOSCOPY: Moderate sized internal hemorrhoids noted. Brown stool, heme negative.  Msk:  Symmetrical without gross deformities. Without edema, no deformity or joint abnormality.  Neurologic:  Alert and  oriented x4;  grossly normal neurologically.  Skin:   Dry and intact without significant lesions or  rashes. Psychiatric: Oriented to person, place and time. Demonstrates good judgement and reason without abnormal affect or behaviors.   RELEVANT LABS AND IMAGING: CBC    Component Value Date/Time   WBC 7.3 04/28/2023 2017   RBC 4.34 04/28/2023 2017   HGB 13.0 04/28/2023 2017   HCT 40.0 04/28/2023 2017   PLT 240 04/28/2023 2017   MCV 92.2 04/28/2023 2017   MCH 30.0 04/28/2023 2017   MCHC 32.5 04/28/2023 2017   RDW 14.6 04/28/2023 2017   LYMPHSABS 2.5 09/29/2022 1855   MONOABS 0.8 09/29/2022 1855   EOSABS 0.2 09/29/2022 1855   BASOSABS 0.0 09/29/2022 1855    CMP     Component Value Date/Time   NA 138 04/28/2023 2017   NA 140 05/18/2019 1436   K 4.1 04/28/2023 2017   CL 102 04/28/2023 2017   CO2 27 04/28/2023 2017   GLUCOSE 196 (H) 04/28/2023 2017   BUN 15 04/28/2023 2017   BUN 14 05/18/2019 1436   CREATININE 1.29 (H) 04/28/2023 2017   CREATININE 1.16 (H) 09/13/2022 0000   CALCIUM 9.3 04/28/2023 2017   PROT 7.4 09/29/2022 1855   ALBUMIN 3.3 (L) 09/29/2022 1855   AST 18 09/29/2022 1855   ALT 9 09/29/2022 1855   ALKPHOS 72 09/29/2022 1855   BILITOT 0.9 09/29/2022 1855   GFRNONAA 41 (L) 04/28/2023 2017   GFRNONAA 48 (L) 08/30/2020 0000   GFRAA 56 (L) 08/30/2020 0000     Assessment/Plan:      Rectal Bleeding Internal hemorrhoids Increased frequency and volume of rectal bleeding with bowel movements. Anoscopy revealed internal hemorrhoids, no fissures.  Due to persistent rectal bleeding I did recommend colonoscopy, she appears much younger than stated age and would be an adequate candidate for endoscopic procedures.  Patient adamantly declines procedures that involved being put to sleep.  But she is concerned about cancer and wants some type of imaging.  No weight loss.  She is aware that colon cancer may not show up on the CT scan. - Hydrocortisone suppositories twice daily for 14 days. - CT abdomen pelvis with contrast (pending kidney function) - CBC, CMP  Chronic  Constipation Improved with Citrucel, but still having incomplete bowel movements.  May benefit from pelvic  floor physical therapy, she is not interested at this time.  Advised to do colonoscopy which she declines today --Continue Citrucel. --Add MiraLAX one capful daily.  Right Lower Quadrant Pain Constant, non-radiating pain. Patient concerned about possible malignancy.  No associated triggers.  Possibly related to constipation versus musculoskeletal. --Order CT scan to evaluate - Continue bowel regimen to control constipation  Gastroesophageal Reflux Disease (GERD) Persistent intermittent epigastric pain despite Famotidine 20mg .  Patient not interested in PPIs --Increase Famotidine to 40mg  daily. - Educated patient on lifestyle modifications - Provided patient education handout - Will evaluate during CT scan as well     Lara Mulch [facility] 08/26/2023, 9:58 AM  Cc: Fleet Contras, MD

## 2023-08-30 ENCOUNTER — Ambulatory Visit (HOSPITAL_COMMUNITY)
Admission: RE | Admit: 2023-08-30 | Discharge: 2023-08-30 | Disposition: A | Discharge status: Home / Self Care | Payer: Medicare Other | Source: Ambulatory Visit | Attending: Gastroenterology | Admitting: Gastroenterology

## 2023-08-30 DIAGNOSIS — K219 Gastro-esophageal reflux disease without esophagitis: Secondary | ICD-10-CM

## 2023-08-30 DIAGNOSIS — R1031 Right lower quadrant pain: Secondary | ICD-10-CM | POA: Diagnosis present

## 2023-08-30 DIAGNOSIS — G8929 Other chronic pain: Secondary | ICD-10-CM | POA: Diagnosis present

## 2023-08-30 DIAGNOSIS — K5900 Constipation, unspecified: Secondary | ICD-10-CM

## 2023-08-30 DIAGNOSIS — K625 Hemorrhage of anus and rectum: Secondary | ICD-10-CM

## 2023-08-30 MED ORDER — IOHEXOL 300 MG/ML  SOLN
100.0000 mL | Freq: Once | INTRAMUSCULAR | Status: AC | PRN
  Administered 2023-08-30: 100 mL via INTRAVENOUS

## 2023-08-30 MED ORDER — IOHEXOL 300 MG/ML  SOLN
30.0000 mL | Freq: Once | INTRAMUSCULAR | Status: AC | PRN
  Administered 2023-08-30: 30 mL via ORAL

## 2023-09-02 ENCOUNTER — Telehealth: Payer: Self-pay

## 2023-09-02 NOTE — Telephone Encounter (Signed)
 I spoke to Newton and I advised her of her results.  She asked about her medication refills and confirmed that they were sent in to her pharmacy.  Patient verbalized that she understood her results.

## 2023-09-02 NOTE — Telephone Encounter (Signed)
-----   Message from Legrand Como sent at 08/26/2023  1:19 PM EST ----- Please let the patient know that her lab results have come back.  Her kidney function is stable.  No evidence of anemia or infection.  Her thyroid is normal.  We will await her CT scan

## 2023-09-02 NOTE — Telephone Encounter (Signed)
 Lmtcb. (Re: lab results)

## 2023-09-18 ENCOUNTER — Telehealth: Payer: Self-pay | Admitting: Physician Assistant

## 2023-09-18 NOTE — Telephone Encounter (Signed)
 Inbound call from patient stating that she had received her results on mychart and stated that it showed the results were abnormal. She then stated she got on there later on because she had received a Mychart message and stated that her results were normal. Patient is requesting a call back to clarify. Please advise.

## 2023-09-18 NOTE — Telephone Encounter (Signed)
 Results discussed with pt and she is aware. States she cannot afford the anusol supp. She will try using prep h supp with some huydrocortisone cream on top of the supp.

## 2023-10-21 ENCOUNTER — Ambulatory Visit: Payer: Medicare Other | Admitting: Gastroenterology

## 2023-11-01 ENCOUNTER — Emergency Department (HOSPITAL_COMMUNITY)
Admission: EM | Admit: 2023-11-01 | Discharge: 2023-11-01 | Disposition: A | Discharge status: Home / Self Care | Attending: Emergency Medicine | Admitting: Emergency Medicine

## 2023-11-01 ENCOUNTER — Emergency Department (HOSPITAL_COMMUNITY)

## 2023-11-01 ENCOUNTER — Other Ambulatory Visit: Payer: Self-pay

## 2023-11-01 ENCOUNTER — Encounter (HOSPITAL_COMMUNITY): Payer: Self-pay

## 2023-11-01 DIAGNOSIS — I2510 Atherosclerotic heart disease of native coronary artery without angina pectoris: Secondary | ICD-10-CM | POA: Diagnosis not present

## 2023-11-01 DIAGNOSIS — Z79899 Other long term (current) drug therapy: Secondary | ICD-10-CM | POA: Insufficient documentation

## 2023-11-01 DIAGNOSIS — R079 Chest pain, unspecified: Secondary | ICD-10-CM | POA: Diagnosis present

## 2023-11-01 DIAGNOSIS — Z7982 Long term (current) use of aspirin: Secondary | ICD-10-CM | POA: Insufficient documentation

## 2023-11-01 DIAGNOSIS — I10 Essential (primary) hypertension: Secondary | ICD-10-CM | POA: Insufficient documentation

## 2023-11-01 DIAGNOSIS — E119 Type 2 diabetes mellitus without complications: Secondary | ICD-10-CM | POA: Diagnosis not present

## 2023-11-01 LAB — COMPREHENSIVE METABOLIC PANEL WITH GFR
ALT: 7 U/L (ref 0–44)
AST: 18 U/L (ref 15–41)
Albumin: 2.9 g/dL — ABNORMAL LOW (ref 3.5–5.0)
Alkaline Phosphatase: 56 U/L (ref 38–126)
Anion gap: 9 (ref 5–15)
BUN: 17 mg/dL (ref 8–23)
CO2: 27 mmol/L (ref 22–32)
Calcium: 9.2 mg/dL (ref 8.9–10.3)
Chloride: 102 mmol/L (ref 98–111)
Creatinine, Ser: 1.2 mg/dL — ABNORMAL HIGH (ref 0.44–1.00)
GFR, Estimated: 45 mL/min — ABNORMAL LOW (ref >60)
Glucose, Bld: 92 mg/dL (ref 70–99)
Potassium: 4.4 mmol/L (ref 3.5–5.1)
Sodium: 138 mmol/L (ref 135–145)
Total Bilirubin: 0.5 mg/dL (ref 0.0–1.2)
Total Protein: 7.2 g/dL (ref 6.5–8.1)

## 2023-11-01 LAB — LIPASE, BLOOD: Lipase: 19 U/L (ref 11–51)

## 2023-11-01 LAB — CBC
HCT: 39.6 % (ref 36.0–46.0)
Hemoglobin: 12.7 g/dL (ref 12.0–15.0)
MCH: 29.5 pg (ref 26.0–34.0)
MCHC: 32.1 g/dL (ref 30.0–36.0)
MCV: 91.9 fL (ref 80.0–100.0)
Platelets: 266 10*3/uL (ref 150–400)
RBC: 4.31 MIL/uL (ref 3.87–5.11)
RDW: 14.3 % (ref 11.5–15.5)
WBC: 8 10*3/uL (ref 4.0–10.5)
nRBC: 0 % (ref 0.0–0.2)

## 2023-11-01 LAB — TROPONIN I (HIGH SENSITIVITY)
Troponin I (High Sensitivity): 12 ng/L (ref <18)
Troponin I (High Sensitivity): 11 ng/L (ref <18)

## 2023-11-01 MED ORDER — ALUM & MAG HYDROXIDE-SIMETH 200-200-20 MG/5ML PO SUSP
30.0000 mL | Freq: Once | ORAL | Status: AC
  Administered 2023-11-01: 30 mL via ORAL
  Filled 2023-11-01: qty 30

## 2023-11-01 MED ORDER — LIDOCAINE VISCOUS HCL 2 % MT SOLN
15.0000 mL | Freq: Once | OROMUCOSAL | Status: AC
  Administered 2023-11-01: 15 mL via ORAL
  Filled 2023-11-01: qty 15

## 2023-11-01 NOTE — ED Provider Triage Note (Signed)
 Emergency Medicine Provider Triage Evaluation Note  Linda Frye , a 83 y.o. female  was evaluated in triage.  Pt complains of mid substernal chest pain that began last night.  Patient states she has had this chest pain in the past and been evaluated by the ED and cardiologist but not found a cause.  Patient states that he gets worse after eating but feels better after belching.  Patient states is worse when lying down.  Patient took 324 mg aspirin last night which seemed to help a little bit however when she got up this morning she noticed the discomfort again.  Chest pain does not radiate.  Review of Systems  Positive:  Negative:   Physical Exam  BP (!) 139/113 (BP Location: Right Arm)   Pulse (!) 52   Temp 97.7 F (36.5 C) (Oral)   Resp 16   Ht 5\' 2"  (1.575 m)   Wt 89.4 kg   SpO2 100%   BMI 36.03 kg/m  Gen:   Awake, no distress   Resp:  Normal effort  MSK:   Moves extremities without difficulty  Other:  Epigastric tenderness unable to reproduce chest pain when I press on patient's epigastrium, no peritoneal signs  Medical Decision Making  Medically screening exam initiated at 2:14 PM.  Appropriate orders placed.  [name] was informed that the remainder of the evaluation will be completed by another provider, this initial triage assessment does not replace that evaluation, and the importance of remaining in the ED until their evaluation is complete.  Workup initiated, patient does have epigastric tenderness when I press without peritoneal signs and this does reproduce her chest pain suspicious of possible pancreatitis or gastritis, will give GI cocktail and get labs, patient stable.   [name], PA-C 11/01/23 1415

## 2023-11-01 NOTE — ED Notes (Signed)
 CCMD contacted

## 2023-11-01 NOTE — ED Provider Notes (Signed)
 [facility] Provider Note   CSN: [id number] Arrival date & time: 11/01/23  1249     History  Chief Complaint  Patient presents with   Chest Pain   HPI  Linda Frye is a 83 y.o. female with past medical history hypertension, CAD, diverticulosis, diabetes, GERD presents due to chest pain.  Patient states that last night, she began having pressure-like chest pain to the left side of her chest that went to her left shoulder.  No neck pain or pain while arms.  No vomiting or diaphoresis.  She does not currently have chest pain, states it has been intermittent since last night.  She has seen a cardiologist in the past with no ischemic findings.  She has no prior DVT or PEs.  Does not take any blood thinners.  Denies any leg pain or leg swelling.  No recent surgeries or hospitalizations.  She took aspirin yesterday night.   Chest Pain      Home Medications Prior to Admission medications   Medication Sig Start Date End Date Taking? Authorizing Provider  acetaminophen (TYLENOL) 500 MG tablet Take 500 mg by mouth daily as needed for headache.    [provider]  albuterol (VENTOLIN HFA) 108 (90 Base) MCG/ACT inhaler Inhale 2 puffs into the lungs every 6 (six) hours as needed. 01/01/23   [provider]  aspirin EC 81 MG tablet Take 81 mg by mouth daily.    [provider]  cholecalciferol (VITAMIN D3) 25 MCG (1000 UT) tablet Take 1,000 Units by mouth daily.    [provider]  famotidine  (PEPCID ) 40 MG tablet Take 1 tablet (40 mg total) by mouth at bedtime. 08/26/23   McMichael, Elba Greathouse, PA-C  furosemide  (LASIX ) 20 MG tablet Take 1 tablet (20 mg total) by mouth daily. 05/16/19   Sueellen Emery, MD  hydrocortisone  (ANUSOL -HC) 25 MG suppository Place 1 suppository (25 mg total) rectally every 12 (twelve) hours. 08/26/23   McMichael, Elba Greathouse, PA-C  nateglinide (STARLIX) 120 MG tablet Take 120 mg by mouth 3  (three) times daily with meals.    [provider]  nitroGLYCERIN (NITROSTAT) 0.4 MG SL tablet Place 1 tablet under the tongue every 5 (five) minutes as needed for chest pain. 07/24/19   [provider]  sertraline (ZOLOFT) 50 MG tablet Take 50 mg by mouth daily. 01/03/21   [provider]  simvastatin (ZOCOR) 40 MG tablet Take 40 mg by mouth daily.    [provider]  vitamin B-12 (CYANOCOBALAMIN) 500 MCG tablet Take 500 mcg by mouth daily.    [provider]      Allergies    Epinephrine    Review of Systems   Review of Systems  Cardiovascular:  Positive for chest pain.    Physical Exam Updated Vital Signs BP (!) 141/59   Pulse (!) 55   Temp (!) 97.5 F (36.4 C) (Oral)   Resp 17   Ht 5\' 2"  (1.575 m)   Wt 89.4 kg   SpO2 100%   BMI 36.03 kg/m  Physical Exam Vitals and nursing note reviewed.  Constitutional:      General: She is not in acute distress.    Appearance: She is well-developed.  HENT:     Head: Normocephalic and atraumatic.  Eyes:     Conjunctiva/sclera: Conjunctivae normal.  Cardiovascular:     Rate and Rhythm: Normal rate and regular rhythm.     Pulses:  Radial pulses are 2+ on the right side and 2+ on the left side.     Heart sounds: No murmur heard. Pulmonary:     Effort: Pulmonary effort is normal. No respiratory distress.     Breath sounds: Normal breath sounds.  Abdominal:     Palpations: Abdomen is soft.     Tenderness: There is no abdominal tenderness.  Musculoskeletal:        General: No swelling.     Cervical back: Neck supple.     Right lower leg: No edema.     Left lower leg: No edema.  Skin:    General: Skin is warm and dry.     Capillary Refill: Capillary refill takes less than 2 seconds.  Neurological:     Mental Status: She is alert.  Psychiatric:        Mood and Affect: Mood normal.     ED Results / Procedures / Treatments   Labs (all labs ordered are listed, but only  abnormal results are displayed) Labs Reviewed  COMPREHENSIVE METABOLIC PANEL WITH GFR - Abnormal; Notable for the following components:      Result Value   Creatinine, Ser 1.20 (*)    Albumin 2.9 (*)    GFR, Estimated 45 (*)    All other components within normal limits  CBC  LIPASE, BLOOD  TROPONIN I (HIGH SENSITIVITY)  TROPONIN I (HIGH SENSITIVITY)    EKG None  Radiology DG Chest 1 View Result Date: 11/01/2023 CLINICAL DATA:  Chest pain. EXAM: CHEST  1 VIEW COMPARISON:  Chest radiograph dated 04/28/2023. FINDINGS: There is diffuse chronic antral coarsening. No new consolidation, pleural effusion, pneumothorax. Stable cardiac silhouette. Atherosclerotic calcification of the aortic arch. Degenerative changes of the spine. No acute osseous pathology. IMPRESSION: No active disease. Electronically Signed   By: Angus Bark M.D.   On: 11/01/2023 15:37    Procedures Procedures    Medications Ordered in ED Medications  alum & mag hydroxide-simeth (MAALOX/MYLANTA) 200-200-20 MG/5ML suspension 30 mL (30 mLs Oral Given 11/01/23 1423)    And  lidocaine  (XYLOCAINE ) 2 % viscous mouth solution 15 mL (15 mLs Oral Given 11/01/23 1423)    ED Course/ Medical Decision Making/ A&P             HEART Score: 5                    Medical Decision Making  Patient is alert, afebrile, and hemodynamically stable in no acute distress.  Physical exam as noted above.  Differential includes ACS, PE, pneumonia, dissection, GERD, CHF, amongst other diagnoses.  Patient had received Maalox/lidocaine  through triage.  Labs obtained through triage demonstrate unremarkable CBC with no leukocytosis, no anemia, CMP with no severe electrolyte derangements, BUN 17 and creatinine 1.2, near patient's baseline.  First troponin 12, second troponin pending.  Lipase WNL. I personally interpreted patient's EKG, which demonstrated sinus bradycardia, normal intervals, PVCs, no acute ischemic changes.  I personally  interpreted patient's chest x-ray, which demonstrated no focal consolidations concerning for pneumonia.  Given resolution of pain with stable vital signs and 2+ radial pulses, low concern for dissection.  Low risk for PE.  Physical exam without any volume overload, crackles, lower extremity edema, or murmur to suggest CHF.  Second troponin resulted at 11, workup reassuring against ACS.  Patient is moderate risk per heart score, requires close outpatient follow-up.  I placed resources in AVS for this as well as referral.  Spoke with patient about  reassuring workup today and close outpatient follow-up.  She is agreeable to this plan.  Strict return precautions were given, patient was discharged in stable condition.  Patient seen in conjunction with Dr. Delana Favors, who agreed with the above work-up and plan of care.        Final Clinical Impression(s) / ED Diagnoses Final diagnoses:  Chest pain, unspecified type    Rx / DC Orders ED Discharge Orders          Ordered    Ambulatory referral to Cardiology       Comments: If you have not heard from the Cardiology office within the next 72 hours please call [phone].   11/01/23 1737              Lorain Robson, MD 11/01/23 2242    Dalene Duck, MD 11/01/23 [phone]

## 2023-11-01 NOTE — ED Triage Notes (Signed)
 Pt c/o mid-sternal chest pain that started last night. Pt states pain is intermittent, non-radiating. Pt denies shortness of breath, nausea or vomiting with it. Pt states she has some pain in back and in her head.

## 2023-11-01 NOTE — ED Notes (Signed)
 PTAR has been scheduled for the patient to be transported to [facility].  Per dispatch, the patient is 7th on the list and an ETA could not be provided at this time.

## 2023-11-01 NOTE — Discharge Instructions (Addendum)
 You were seen here for chest pain with your workup showing no emergency cause for your symptoms.  It is still very important that he follow-up with cardiology in the next few days for reevaluation.  Please return to the ED for any emergency medical symptoms.

## 2023-12-27 ENCOUNTER — Ambulatory Visit: Admitting: Physician Assistant

## 2024-04-10 ENCOUNTER — Ambulatory Visit (HOSPITAL_BASED_OUTPATIENT_CLINIC_OR_DEPARTMENT_OTHER): Admitting: Cardiology

## 2024-04-10 ENCOUNTER — Encounter (HOSPITAL_BASED_OUTPATIENT_CLINIC_OR_DEPARTMENT_OTHER): Payer: Self-pay | Admitting: Cardiology

## 2024-04-10 VITALS — BP 126/84 | HR 52 | Ht 62.0 in | Wt 204.0 lb

## 2024-04-10 DIAGNOSIS — R600 Localized edema: Secondary | ICD-10-CM | POA: Diagnosis not present

## 2024-04-10 DIAGNOSIS — R0789 Other chest pain: Secondary | ICD-10-CM

## 2024-04-10 DIAGNOSIS — R1084 Generalized abdominal pain: Secondary | ICD-10-CM | POA: Diagnosis not present

## 2024-04-10 DIAGNOSIS — I10 Essential (primary) hypertension: Secondary | ICD-10-CM

## 2024-04-10 DIAGNOSIS — I739 Peripheral vascular disease, unspecified: Secondary | ICD-10-CM

## 2024-04-10 NOTE — Patient Instructions (Signed)
 Medication Instructions:  No changes *If you need a refill on your cardiac medications before your next appointment, please call your pharmacy*  Lab Work: none  Testing/Procedures: none  Follow-Up: At [facility], you and your health needs are our priority.  As part of our continuing mission to provide you with exceptional heart care, our providers are all part of one team.  This team includes your primary Cardiologist (physician) and Advanced Practice Providers or APPs (Physician Assistants and Nurse Practitioners) who all work together to provide you with the care you need, when you need it.  Your next appointment:   12 month(s)  Provider:   Shelda Bruckner, MD

## 2024-04-10 NOTE — Progress Notes (Signed)
  Cardiology Office Note:  .   Date:  04/10/2024  ID:  Linda Frye, DOB 10/01/1940, MRN [id number] PCP: Shelda Atlas, MD  [facility] Providers Cardiologist:  Shelda Bruckner, MD {  History of Present Illness: .   Linda Frye is a 83 y.o. female with a hx of type II diabetes, hypertension, CAD who is seen for follow up today. I initially met her 07/30/19 as a new consult at the request of Shelda Atlas, MD for the evaluation and management of hypertension and syncope.   Today: Called EMS 9/17 for chest pain, felt like it just kept coming, different pain than her usual. Sharp, focal but moved across her abdomen and chest. Brings EMS Ecg strip from event, no ischemic changes.   We discussed her pain pattern, potential etiologies, red flags, monitoring recommendations, see below.  ROS: Denies shortness of breath at rest or with normal exertion. No PND, orthopnea, LE edema or unexpected weight gain. No syncope or palpitations. ROS otherwise negative except as noted.   Studies Reviewed: SABRA    EKG:       Physical Exam:   VS:  BP 126/84   Pulse (!) 52   Ht 5' 2 (1.575 m)   Wt 204 lb (92.5 kg)   SpO2 95%   BMI 37.31 kg/m    Wt Readings from Last 3 Encounters:  04/10/24 204 lb (92.5 kg)  11/01/23 197 lb (89.4 kg)  08/26/23 201 lb 8 oz (91.4 kg)    GEN: Well nourished, well developed in no acute distress HEENT: Normal, moist mucous membranes NECK: No JVD CARDIAC: regular rhythm, normal S1 and S2, no rubs or gallops. 1/6 systolic murmur. VASCULAR: Radial and DP pulses 2+ bilaterally. No carotid bruits RESPIRATORY:  Clear to auscultation without rales, wheezing or rhonchi  ABDOMEN: Soft, non-tender, non-distended MUSCULOSKELETAL:  Ambulates independently SKIN: Warm and dry, trivial bilateral LE edema NEUROLOGIC:  Alert and oriented x 3. No focal neuro deficits noted. PSYCHIATRIC:  Normal affect    ASSESSMENT AND PLAN: .    Atypical chest/upper abdominal  pain: -low suspicion for cardiac etiology, but did discuss red flag warning signs that need immediate medical attention -she has been told in the past that this pain was due to hiatal hernia and gallstones -we discussed different etiologies of pain at length. She will try to keep a log to find a pattern   Hypertension:  -continue furosemide  20 mg   Peripheral vascular disease Intermittent LE edema -continue aspirin, simvastatin -PVD followed by Dr. Harvey -has obesity, BMI 37, as additional risk factor -feels compression stockings make symptoms worse  CV risk counseling and prevention -recommend heart healthy/Mediterranean diet, with whole grains, fruits, vegetable, fish, lean meats, nuts, and olive oil. Limit salt. -recommend moderate walking, 3-5 times/week for 30-50 minutes each session. Aim for at least 150 minutes/week. Goal should be pace of 3 miles/hours, or walking 1.5 miles in 30 minutes -recommend avoidance of tobacco products. Avoid excess alcohol.  Dispo: 1 year or sooner as needed  Signed, Shelda Bruckner, MD   Shelda Bruckner, MD, PhD, [facility]  [facility]  Industry  Heart & Vascular at [facility] at [facility] [address] [place], [place] [address] [phone]

## 2024-07-06 ENCOUNTER — Encounter: Payer: Self-pay | Admitting: Emergency Medicine

## 2024-07-06 ENCOUNTER — Ambulatory Visit
Admission: EM | Admit: 2024-07-06 | Discharge: 2024-07-06 | Disposition: A | Discharge status: Home / Self Care | Attending: Family Medicine | Admitting: Family Medicine

## 2024-07-06 DIAGNOSIS — J4521 Mild intermittent asthma with (acute) exacerbation: Secondary | ICD-10-CM

## 2024-07-06 DIAGNOSIS — R1310 Dysphagia, unspecified: Secondary | ICD-10-CM | POA: Insufficient documentation

## 2024-07-06 DIAGNOSIS — J069 Acute upper respiratory infection, unspecified: Secondary | ICD-10-CM

## 2024-07-06 DIAGNOSIS — R159 Full incontinence of feces: Secondary | ICD-10-CM | POA: Insufficient documentation

## 2024-07-06 DIAGNOSIS — Z860101 Personal history of adenomatous and serrated colon polyps: Secondary | ICD-10-CM | POA: Insufficient documentation

## 2024-07-06 DIAGNOSIS — K8020 Calculus of gallbladder without cholecystitis without obstruction: Secondary | ICD-10-CM | POA: Insufficient documentation

## 2024-07-06 DIAGNOSIS — K5900 Constipation, unspecified: Secondary | ICD-10-CM | POA: Insufficient documentation

## 2024-07-06 LAB — POCT INFLUENZA A/B
Influenza A, POC: NEGATIVE
Influenza B, POC: NEGATIVE

## 2024-07-06 LAB — POC SOFIA SARS ANTIGEN FIA: SARS Coronavirus 2 Ag: NEGATIVE

## 2024-07-06 MED ORDER — PREDNISONE 20 MG PO TABS: 40.0000 mg | ORAL_TABLET | Freq: Every day | ORAL | 0 refills | Status: AC

## 2024-07-06 MED ORDER — IPRATROPIUM-ALBUTEROL 0.5-2.5 (3) MG/3ML IN SOLN
3.0000 mL | Freq: Once | RESPIRATORY_TRACT | Status: AC
  Administered 2024-07-06: 3 mL via RESPIRATORY_TRACT

## 2024-07-06 MED ORDER — ALBUTEROL SULFATE HFA 108 (90 BASE) MCG/ACT IN AERS: 2.0000 | INHALATION_SPRAY | RESPIRATORY_TRACT | 0 refills | Status: AC | PRN

## 2024-07-06 MED ORDER — BENZONATATE 100 MG PO CAPS: 100.0000 mg | ORAL_CAPSULE | Freq: Three times a day (TID) | ORAL | 0 refills | Status: AC | PRN

## 2024-07-06 MED ORDER — DEXAMETHASONE SOD PHOSPHATE PF 10 MG/ML IJ SOLN
10.0000 mg | Freq: Once | INTRAMUSCULAR | Status: AC
  Administered 2024-07-06: 10 mg via INTRAMUSCULAR

## 2024-07-06 NOTE — ED Provider Notes (Signed)
 " [facility]    CSN: [id number] Arrival date & time: 07/06/24  1750      History   Chief Complaint Chief Complaint  Patient presents with   Nasal Congestion   Cough    HPI Linda Frye is a 83 y.o. female.    Cough  Here for cough and nasal congestion and wheezing and shortness of breath.  She has also had some ringing in her ears but no ear pain.  She had a temperature of 101 earlier today.  Symptoms began on December 26.  She is allergic to epinephrine but nothing else  She has never been diagnosed with asthma but she has wheezed before and she has used an inhaler in the past  No history of tobacco use  Past Medical History:  Diagnosis Date   Acute carpal tunnel syndrome    bilateral wrists   Anxiety and depression    Arthritis    Chronic kidney disease    Coronary artery disease    Diabetes mellitus without complication (HCC)    Diverticulitis    Diverticulosis    GERD (gastroesophageal reflux disease)    Hiatal hernia    History of colon polyps    Hypertension    Renal disorder    kidneys 35% function   Uterine cancer [facility])     Patient Active Problem List   Diagnosis Date Noted   Gallstones 07/06/2024   Constipation 07/06/2024   Dysphagia 07/06/2024   History of adenomatous polyp of colon 07/06/2024   Incontinence of feces 07/06/2024   Orthostatic hypotension 05/19/2019   T2DM (type 2 diabetes mellitus) (HCC) 05/18/2019   Syncope 05/18/2019   UTI (urinary tract infection) 05/18/2019   Abnormal cardiovascular stress test 02/05/2018   Abnormal coronary angiogram 02/05/2018   Coronary artery disease involving native coronary artery of native heart with angina pectoris with documented spasm 02/05/2018   Abdominal discomfort 05/16/2017   Stroke-like symptoms 03/01/2017   Stage 3 chronic kidney disease (HCC) 12/14/2016   Trigeminal neuralgia 09/25/2016   Mixed hyperlipidemia 06/26/2016   Cholelithiasis without obstruction  05/10/2016   Essential hypertension 09/16/2015   Angina at rest 03/28/2011   Bursitis of left shoulder 03/28/2011   Precordial pain 03/28/2011    Past Surgical History:  Procedure Laterality Date   ABDOMINAL HYSTERECTOMY     COLONOSCOPY  2018   kings daughter royann [place] said they removed 5-6 colon popys   ESOPHAGOGASTRODUODENOSCOPY  2018   kings daughter ashland [place]   TUBAL LIGATION      OB History   No obstetric history on file.      Home Medications    Prior to Admission medications  Medication Sig Start Date End Date Taking? Authorizing Provider  albuterol  (VENTOLIN  HFA) 108 (90 Base) MCG/ACT inhaler Inhale 2 puffs into the lungs every 4 (four) hours as needed for wheezing or shortness of breath. 07/06/24  Yes Vonna Sharlet POUR, MD  benzonatate  (TESSALON ) 100 MG capsule Take 1 capsule (100 mg total) by mouth 3 (three) times daily as needed for cough. 07/06/24  Yes Vonna Sharlet POUR, MD  omeprazole  (PRILOSEC) 40 MG capsule 1 capsule 30 minutes before morning meal Orally Once a day; Duration: 30 day(s) 08/07/21  Yes [provider]  predniSONE (DELTASONE) 20 MG tablet Take 2 tablets (40 mg total) by mouth daily with breakfast for 5 days. 07/06/24 07/11/24 Yes Keren Alverio K, MD  sertraline (ZOLOFT) 100 MG tablet SMARTSIG:1 Tablet(s) By Mouth Every Evening 06/11/24  Yes [provider]  sucralfate (CARAFATE) 1 g tablet 1 tablet on an empty stomach Orally twice a day as needed; Duration: 30 day(s) 04/06/20  Yes [provider]  triamcinolone cream (KENALOG) 0.1 % Apply topically. 11/23/16  Yes [provider]  acetaminophen (TYLENOL) 500 MG tablet Take 500 mg by mouth daily as needed for headache.    [provider]  aspirin EC 81 MG tablet Take 81 mg by mouth daily.    [provider]  cholecalciferol (VITAMIN D3) 25 MCG (1000 UT) tablet Take 1,000 Units by mouth daily.    [provider]  diclofenac Sodium (VOLTAREN)  1 % GEL External; Duration: 32    [provider]  famotidine  (PEPCID ) 40 MG tablet Take 1 tablet (40 mg total) by mouth at bedtime. 08/26/23   McMichael, Nestor HERO, PA-C  furosemide  (LASIX ) 20 MG tablet Take 1 tablet (20 mg total) by mouth daily. 05/16/19   Dean Clarity, MD  hydrocortisone  (ANUSOL -HC) 25 MG suppository Place 1 suppository (25 mg total) rectally every 12 (twelve) hours. 08/26/23   McMichael, Nestor HERO, PA-C  nateglinide (STARLIX) 120 MG tablet Take 120 mg by mouth 3 (three) times daily with meals.    [provider]  nitroGLYCERIN (NITROSTAT) 0.4 MG SL tablet Place 1 tablet under the tongue every 5 (five) minutes as needed for chest pain. 07/24/19   [provider]  ofloxacin (FLOXIN) 0.3 % OTIC solution Ophthalmic; Duration: 15    [provider]  omeprazole  (PRILOSEC) 20 MG capsule Oral; Duration: 30    [provider]  sertraline (ZOLOFT) 50 MG tablet Take 50 mg by mouth daily. 01/03/21   [provider]  simvastatin (ZOCOR) 40 MG tablet Take 40 mg by mouth daily.    [provider]  traZODone (DESYREL) 50 MG tablet Oral; Duration: 30    [provider]  valsartan  (DIOVAN ) 40 MG tablet Oral; Duration: 90    [provider]  vitamin B-12 (CYANOCOBALAMIN) 500 MCG tablet Take 500 mcg by mouth daily.    [provider]    Family History Family History  Problem Relation Age of Onset   Heart disease Mother    Breast cancer Sister    Uterine cancer Sister    Lung cancer Sister    Colon cancer Neg Hx    Esophageal cancer Neg Hx     Social History Social History[1]   Allergies   Epinephrine   Review of Systems Review of Systems  Respiratory:  Positive for cough.      Physical Exam Triage Vital Signs ED Triage Vitals  Encounter Vitals Group     BP 07/06/24 1902 (!) 141/80     Girls Systolic BP Percentile --      Girls Diastolic BP Percentile --      Boys Systolic BP  Percentile --      Boys Diastolic BP Percentile --      Pulse Rate 07/06/24 1902 82     Resp 07/06/24 1902 18     Temp 07/06/24 1902 98.9 F (37.2 C)     Temp Source 07/06/24 1902 Oral     SpO2 07/06/24 1902 95 %     Weight --      Height --      Head Circumference --      Peak Flow --      Pain Score 07/06/24 1901 0     Pain Loc --  Pain Education --      Exclude from Growth Chart --    No data found.  Updated Vital Signs BP (!) 141/80 (BP Location: Left Arm)   Pulse 82   Temp 98.9 F (37.2 C) (Oral)   Resp 18   SpO2 95%   Visual Acuity Right Eye Distance:   Left Eye Distance:   Bilateral Distance:    Right Eye Near:   Left Eye Near:    Bilateral Near:     Physical Exam Vitals reviewed.  Constitutional:      General: She is not in acute distress.    Appearance: She is not toxic-appearing.  HENT:     Right Ear: Tympanic membrane and ear canal normal.     Left Ear: Tympanic membrane and ear canal normal.     Nose: Congestion present.     Mouth/Throat:     Mouth: Mucous membranes are moist.     Comments: There is clear mucus in the posterior oropharynx with some erythema Eyes:     Extraocular Movements: Extraocular movements intact.     Conjunctiva/sclera: Conjunctivae normal.     Pupils: Pupils are equal, round, and reactive to light.  Cardiovascular:     Rate and Rhythm: Normal rate and regular rhythm.     Heart sounds: No murmur heard. Pulmonary:     Effort: No respiratory distress.     Breath sounds: No stridor. No rhonchi or rales.     Comments: There is expiratory wheezing heard on exam.  No rales or rhonchi.  Expiratory phase is prolonged Chest:     Chest wall: No tenderness.  Musculoskeletal:     Cervical back: Neck supple.  Lymphadenopathy:     Cervical: No cervical adenopathy.  Skin:    Capillary Refill: Capillary refill takes less than 2 seconds.     Coloration: Skin is not jaundiced or pale.  Neurological:     General: No focal  deficit present.     Mental Status: She is alert and oriented to person, place, and time.  Psychiatric:        Behavior: Behavior normal.      UC Treatments / Results  Labs (all labs ordered are listed, but only abnormal results are displayed) Labs Reviewed  POC SOFIA SARS ANTIGEN FIA - Normal  POCT INFLUENZA A/B - Normal    EKG   Radiology No results found.  Procedures Procedures (including critical care time)  Medications Ordered in UC Medications  ipratropium-albuterol  (DUONEB) 0.5-2.5 (3) MG/3ML nebulizer solution 3 mL (3 mLs Nebulization Given 07/06/24 2015)  dexamethasone  (DECADRON ) injection 10 mg (10 mg Intramuscular Given 07/06/24 2033)    Initial Impression / Assessment and Plan / UC Course  I have reviewed the triage vital signs and the nursing notes.  Pertinent labs & imaging results that were available during my care of the patient were reviewed by me and considered in my medical decision making (see chart for details).     Flu and COVID testing is negative.  After the DuoNeb treatment she is still wheezing, but the air movement is improved and the expiratory phase is not as prolonged.  Decadron  is given here for the asthma exacerbation.  Tessalon  Perles are sent into the pharmacy along with 5 days of prednisone.  She will pick those up tomorrow.   if she worsens anyway she is to return to be seen.    With her overall normal vitals, do not feel that she has an indication  for a chest x-ray this evening. Final Clinical Impressions(s) / UC Diagnoses   Final diagnoses:  Viral URI  Mild intermittent asthma with acute exacerbation     Discharge Instructions      Testing for flu and COVID was negative.  You have been given a shot of dexamethasone  10 mg, steroid. We gave you 1 dose also in the nebulizer of ipratropium-albuterol .  Albuterol  inhaler--do 2 puffs every 4 hours as needed for shortness of breath or wheezing  Take prednisone 20 mg--2  daily for 5 days  Take benzonatate  100 mg, 1 tab every 8 hours as needed for cough.  Drink plenty of fluids  If you worsen anyway get seen again for further evaluation  Take prednisone 20 mg--2 daily for 5 days      ED Prescriptions     Medication Sig Dispense Auth. Provider   albuterol  (VENTOLIN  HFA) 108 (90 Base) MCG/ACT inhaler Inhale 2 puffs into the lungs every 4 (four) hours as needed for wheezing or shortness of breath. 1 each Vonna Sharlet POUR, MD   predniSONE (DELTASONE) 20 MG tablet Take 2 tablets (40 mg total) by mouth daily with breakfast for 5 days. 10 tablet Vonna Sharlet POUR, MD   benzonatate  (TESSALON ) 100 MG capsule Take 1 capsule (100 mg total) by mouth 3 (three) times daily as needed for cough. 21 capsule Shawnell Dykes K, MD      PDMP not reviewed this encounter.    [1]  Social History Tobacco Use   Smoking status: Never   Smokeless tobacco: Never  Vaping Use   Vaping status: Never Used  Substance Use Topics   Alcohol use: Not Currently   Drug use: Never     Vonna Sharlet POUR, MD 07/06/24 2034  "

## 2024-07-06 NOTE — ED Triage Notes (Signed)
 Patient has nasal congestion and a cough with lots of mucous  that started Friday.  Patient took some medication yesterday however she started having a loud ringing in her ears so she stop it.

## 2024-07-06 NOTE — Discharge Instructions (Signed)
 Testing for flu and COVID was negative.  You have been given a shot of dexamethasone  10 mg, steroid. We gave you 1 dose also in the nebulizer of ipratropium-albuterol .  Albuterol  inhaler--do 2 puffs every 4 hours as needed for shortness of breath or wheezing  Take prednisone 20 mg--2 daily for 5 days  Take benzonatate  100 mg, 1 tab every 8 hours as needed for cough.  Drink plenty of fluids  If you worsen anyway get seen again for further evaluation  Take prednisone 20 mg--2 daily for 5 days

## 2024-08-25 ENCOUNTER — Ambulatory Visit: Admitting: Gastroenterology
